# Patient Record
Sex: Male | Born: 1995 | Race: White | Hispanic: No | Marital: Married | Smoking: Former smoker
Health system: Southern US, Community
[De-identification: ages and names within clinical notes are randomized; demographics above are authoritative.]

## PROBLEM LIST (undated history)

## (undated) DIAGNOSIS — F32A Depression, unspecified: Secondary | ICD-10-CM

## (undated) DIAGNOSIS — F329 Major depressive disorder, single episode, unspecified: Secondary | ICD-10-CM

## (undated) DIAGNOSIS — K219 Gastro-esophageal reflux disease without esophagitis: Secondary | ICD-10-CM

## (undated) HISTORY — PX: HIP SURGERY: SHX245

---
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
## 2015-08-28 ENCOUNTER — Encounter: Payer: Self-pay | Admitting: Emergency Medicine

## 2015-08-28 ENCOUNTER — Emergency Department
Admission: EM | Admit: 2015-08-28 | Discharge: 2015-08-28 | Disposition: A | Discharge status: Home / Self Care | Payer: Self-pay | Attending: Emergency Medicine | Admitting: Emergency Medicine

## 2015-08-28 ENCOUNTER — Telehealth: Payer: Self-pay | Admitting: Medical Oncology

## 2015-08-28 DIAGNOSIS — R1013 Epigastric pain: Secondary | ICD-10-CM

## 2015-08-28 DIAGNOSIS — K2970 Gastritis, unspecified, without bleeding: Secondary | ICD-10-CM | POA: Insufficient documentation

## 2015-08-28 DIAGNOSIS — F17200 Nicotine dependence, unspecified, uncomplicated: Secondary | ICD-10-CM | POA: Insufficient documentation

## 2015-08-28 LAB — COMPREHENSIVE METABOLIC PANEL
ALK PHOS: 72 U/L (ref 38–126)
ALT: 37 U/L (ref 17–63)
ANION GAP: 10 (ref 5–15)
AST: 30 U/L (ref 15–41)
Albumin: 4.9 g/dL (ref 3.5–5.0)
BUN: 12 mg/dL (ref 6–20)
CALCIUM: 9.4 mg/dL (ref 8.9–10.3)
CO2: 26 mmol/L (ref 22–32)
Chloride: 104 mmol/L (ref 101–111)
Creatinine, Ser: 0.8 mg/dL (ref 0.61–1.24)
GFR calc non Af Amer: >60 mL/min (ref >60)
Glucose, Bld: 108 mg/dL — ABNORMAL HIGH (ref 65–99)
POTASSIUM: 3.6 mmol/L (ref 3.5–5.1)
SODIUM: 140 mmol/L (ref 135–145)
TOTAL PROTEIN: 8.4 g/dL — AB (ref 6.5–8.1)
Total Bilirubin: 0.9 mg/dL (ref 0.3–1.2)

## 2015-08-28 LAB — CBC WITH DIFFERENTIAL/PLATELET
BASOS PCT: 0 %
Basophils Absolute: 0 10*3/uL (ref 0–0.1)
Eosinophils Absolute: 0.1 10*3/uL (ref 0–0.7)
Eosinophils Relative: 1 %
HEMATOCRIT: 44.7 % (ref 40.0–52.0)
HEMOGLOBIN: 15.1 g/dL (ref 13.0–18.0)
LYMPHS ABS: 1.4 10*3/uL (ref 1.0–3.6)
Lymphocytes Relative: 8 %
MCH: 30.7 pg (ref 26.0–34.0)
MCHC: 33.9 g/dL (ref 32.0–36.0)
MCV: 90.7 fL (ref 80.0–100.0)
MONO ABS: 0.9 10*3/uL (ref 0.2–1.0)
MONOS PCT: 5 %
NEUTROS ABS: 15.4 10*3/uL — AB (ref 1.4–6.5)
Neutrophils Relative %: 86 %
Platelets: 237 10*3/uL (ref 150–440)
RBC: 4.93 MIL/uL (ref 4.40–5.90)
RDW: 12.8 % (ref 11.5–14.5)
WBC: 17.9 10*3/uL — AB (ref 3.8–10.6)

## 2015-08-28 LAB — LIPASE, BLOOD: Lipase: 19 U/L (ref 11–51)

## 2015-08-28 LAB — TROPONIN I

## 2015-08-28 MED ORDER — ONDANSETRON HCL 4 MG/2ML IJ SOLN
INTRAMUSCULAR | Status: AC
  Administered 2015-08-28: 4 mg via INTRAVENOUS
  Filled 2015-08-28: qty 2

## 2015-08-28 MED ORDER — HALOPERIDOL LACTATE 5 MG/ML IJ SOLN
5.0000 mg | Freq: Once | INTRAMUSCULAR | Status: AC
  Administered 2015-08-28: 5 mg via INTRAVENOUS

## 2015-08-28 MED ORDER — SUCRALFATE 1 G PO TABS: 1.0000 g | ORAL_TABLET | Freq: Four times a day (QID) | ORAL | Status: DC

## 2015-08-28 MED ORDER — RANITIDINE HCL 150 MG PO CAPS: 150.0000 mg | ORAL_CAPSULE | Freq: Two times a day (BID) | ORAL | Status: DC

## 2015-08-28 MED ORDER — HALOPERIDOL LACTATE 5 MG/ML IJ SOLN
INTRAMUSCULAR | Status: AC
  Administered 2015-08-28: 5 mg via INTRAVENOUS
  Filled 2015-08-28: qty 1

## 2015-08-28 MED ORDER — METOCLOPRAMIDE HCL 5 MG/ML IJ SOLN
10.0000 mg | Freq: Once | INTRAMUSCULAR | Status: AC
  Administered 2015-08-28: 10 mg via INTRAVENOUS

## 2015-08-28 MED ORDER — METOCLOPRAMIDE HCL 5 MG/ML IJ SOLN
10.0000 mg | Freq: Once | INTRAMUSCULAR | Status: DC
Start: 2015-08-28 — End: 2015-08-28
  Filled 2015-08-28 (×2): qty 2

## 2015-08-28 MED ORDER — ONDANSETRON HCL 4 MG/2ML IJ SOLN
4.0000 mg | Freq: Once | INTRAMUSCULAR | Status: AC
  Administered 2015-08-28: 4 mg via INTRAVENOUS

## 2015-08-28 MED ORDER — FAMOTIDINE 20 MG PO TABS
40.0000 mg | ORAL_TABLET | Freq: Once | ORAL | Status: AC
  Administered 2015-08-28: 40 mg via ORAL
  Filled 2015-08-28: qty 2

## 2015-08-28 MED ORDER — GI COCKTAIL ~~LOC~~
30.0000 mL | ORAL | Status: AC
  Administered 2015-08-28: 30 mL via ORAL
  Filled 2015-08-28: qty 30

## 2015-08-28 MED ORDER — METOCLOPRAMIDE HCL 10 MG PO TABS: 10.0000 mg | ORAL_TABLET | Freq: Three times a day (TID) | ORAL | Status: DC

## 2015-08-28 NOTE — Discharge Instructions (Signed)
Abdominal Pain, Adult °Many things can cause abdominal pain. Usually, abdominal pain is not caused by a disease and will improve without treatment. It can often be observed and treated at home. Your health care provider will do a physical exam and possibly order blood tests and X-rays to help determine the seriousness of your pain. However, in many cases, more time must pass before a clear cause of the pain can be found. Before that point, your health care provider may not know if you need more testing or further treatment. °HOME CARE INSTRUCTIONS °Monitor your abdominal pain for any changes. The following actions may help to alleviate any discomfort you are experiencing: °· Only take over-the-counter or prescription medicines as directed by your health care provider. °· Do not take laxatives unless directed to do so by your health care provider. °· Try a clear liquid diet (broth, tea, or water) as directed by your health care provider. Slowly move to a bland diet as tolerated. °SEEK MEDICAL CARE IF: °· You have unexplained abdominal pain. °· You have abdominal pain associated with nausea or diarrhea. °· You have pain when you urinate or have a bowel movement. °· You experience abdominal pain that wakes you in the night. °· You have abdominal pain that is worsened or improved by eating food. °· You have abdominal pain that is worsened with eating fatty foods. °· You have a fever. °SEEK IMMEDIATE MEDICAL CARE IF: °· Your pain does not go away within 2 hours. °· You keep throwing up (vomiting). °· Your pain is felt only in portions of the abdomen, such as the right side or the left lower portion of the abdomen. °· You pass bloody or black tarry stools. °MAKE SURE YOU: °· Understand these instructions. °· Will watch your condition. °· Will get help right away if you are not doing well or get worse. °  °This information is not intended to replace advice given to you by your health care provider. Make sure you discuss  any questions you have with your health care provider. °  °Document Released: 04/11/2005 Document Revised: 03/23/2015 Document Reviewed: 03/11/2013 °Elsevier Interactive Patient Education ©2016 [employer] ° °Gastritis, Adult °Gastritis is soreness and swelling (inflammation) of the lining of the stomach. Gastritis can develop as a sudden onset (acute) or long-term (chronic) condition. If gastritis is not treated, it can lead to stomach bleeding and ulcers. °CAUSES  °Gastritis occurs when the stomach lining is weak or damaged. Digestive juices from the stomach then inflame the weakened stomach lining. The stomach lining may be weak or damaged due to viral or bacterial infections. One common bacterial infection is the Helicobacter pylori infection. Gastritis can also result from excessive alcohol consumption, taking certain medicines, or having too much acid in the stomach.  °SYMPTOMS  °In some cases, there are no symptoms. When symptoms are present, they may include: °· Pain or a burning sensation in the upper abdomen. °· Nausea. °· Vomiting. °· An uncomfortable feeling of fullness after eating. °DIAGNOSIS  °Your caregiver may suspect you have gastritis based on your symptoms and a physical exam. To determine the cause of your gastritis, your caregiver may perform the following: °· Blood or stool tests to check for the H pylori bacterium. °· Gastroscopy. A thin, flexible tube (endoscope) is passed down the esophagus and into the stomach. The endoscope has a light and camera on the end. Your caregiver uses the endoscope to view the inside of the stomach. °· Taking a tissue sample (  biopsy) from the stomach to examine under a microscope. °TREATMENT  °Depending on the cause of your gastritis, medicines may be prescribed. If you have a bacterial infection, such as an H pylori infection, antibiotics may be given. If your gastritis is caused by too much acid in the stomach, H2 blockers or antacids may be given. Your  caregiver may recommend that you stop taking aspirin, ibuprofen, or other nonsteroidal anti-inflammatory drugs (NSAIDs). °HOME CARE INSTRUCTIONS °· Only take over-the-counter or prescription medicines as directed by your caregiver. °· If you were given antibiotic medicines, take them as directed. Finish them even if you start to feel better. °· Drink enough fluids to keep your urine clear or pale yellow. °· Avoid foods and drinks that make your symptoms worse, such as: °¨ Caffeine or alcoholic drinks. °¨ Chocolate. °¨ Peppermint or mint flavorings. °¨ Garlic and onions. °¨ Spicy foods. °¨ Citrus fruits, such as oranges, lemons, or limes. °¨ Tomato-based foods such as sauce, chili, salsa, and pizza. °¨ Fried and fatty foods. °· Eat small, frequent meals instead of large meals. °SEEK IMMEDIATE MEDICAL CARE IF:  °· You have black or dark red stools. °· You vomit blood or material that looks like coffee grounds. °· You are unable to keep fluids down. °· Your abdominal pain gets worse. °· You have a fever. °· You do not feel better after 1 week. °· You have any other questions or concerns. °MAKE SURE YOU: °· Understand these instructions. °· Will watch your condition. °· Will get help right away if you are not doing well or get worse. °  °This information is not intended to replace advice given to you by your health care provider. Make sure you discuss any questions you have with your health care provider. °  °Document Released: 06/26/2001 Document Revised: 01/01/2012 Document Reviewed: 08/15/2011 °Elsevier Interactive Patient Education ©2016 Elsevier Inc. ° °

## 2015-08-28 NOTE — ED Notes (Signed)
Pt states "I feel like I am going to throw up", pt encouraged to take slow deep breathes, pt lying in bed, cool clothe on face

## 2015-08-28 NOTE — ED Notes (Signed)
Pt states this AM at 0400 he woke up with nausea and vomiting, states he called EMS because he was "doubled over and had extreme chest pain and could not breathe", states they gave him 4 mg IM zofran, at present pt states 6/10 chest pain, mid chest, also states he is constipated and had a BM yesterday 2/11, pt awake and alert in no acute distress

## 2015-08-28 NOTE — ED Notes (Signed)
Pt has had nausea and vomiting since 4 am.  Also c/o left sided chest pain that is worse with movement.  Has been constipated per pt; last BM was last night.

## 2015-08-28 NOTE — ED Notes (Signed)
MD at bedside. 

## 2015-08-28 NOTE — ED Notes (Signed)
Pt given crackers per request, pt curled up in bed states his stomach hurts, MD notified, pt refused Reglan IV stating "I had a reaction to something IV when I was 11 and I dont remember what it is so I dont want anything"

## 2015-08-28 NOTE — ED Provider Notes (Signed)
[facility] Emergency Department Provider Note  ____________________________________________  Time seen: 7:55am  I have reviewed the triage vital signs and the nursing notes.   HISTORY  Chief Complaint Emesis and Chest Pain    HPI Bernard Benton is a 20 y.o. male who comes to the ED due to nausea and vomiting. He also has some upper abdominal pain. This all started at 4:00 AM when he woke up with nausea vomiting or abdominal pain. He feels that the abdominal pain is due to the vomiting itself. After multiple episodes of vomiting he now feels it in his chest and up to his neck as well. Denies any exertional symptoms, no pleuritic pain. There had anything like this before. Was in his usual state of health at bedtime. Does smoke but no excessive drinking. No trauma. Pain is sharp and intermittent.    History reviewed. No pertinent past medical history.   There are no active problems to display for this patient.    Past Surgical History  Procedure Laterality Date  . Hip surgery       Current Outpatient Rx  Name  Route  Sig  Dispense  Refill  . metoCLOPramide (REGLAN) 10 MG tablet   Oral   Take 1 tablet (10 mg total) by mouth 4 (four) times daily -  before meals and at bedtime.   60 tablet   0   . ranitidine (ZANTAC) 150 MG capsule   Oral   Take 1 capsule (150 mg total) by mouth 2 (two) times daily.   28 capsule   0   . sucralfate (CARAFATE) 1 g tablet   Oral   Take 1 tablet (1 g total) by mouth 4 (four) times daily.   120 tablet   1      Allergies Other   History reviewed. No pertinent family history.  Social History Social History  Substance Use Topics  . Smoking status: Current Every Day Smoker  . Smokeless tobacco: None  . Alcohol Use: Yes    Review of Systems  Constitutional:   No fever or chills. No weight changes Eyes:   No blurry vision or double vision.  ENT:   No sore throat. Cardiovascular:   Positive as above  chest pain. Respiratory:   No dyspnea or cough. Gastrointestinal:   Negative for abdominal pain, no diarrhea. Positive vomiting.  No BRBPR or melena. Genitourinary:   Negative for dysuria, urinary retention, bloody urine, or difficulty urinating. Musculoskeletal:   Negative for back pain. No joint swelling or pain. Skin:   Negative for rash. Neurological:   Negative for headaches, focal weakness or numbness. Psychiatric:  No anxiety or depression.   Endocrine:  No hot/cold intolerance, changes in energy, or sleep difficulty.  10-point ROS otherwise negative.  ____________________________________________   PHYSICAL EXAM:  VITAL SIGNS: ED Triage Vitals  Enc Vitals Group     BP 08/28/15 0740 113/72 mmHg     Pulse Rate 08/28/15 0740 46     Resp 08/28/15 0740 20     Temp 08/28/15 0740 97.6 F (36.4 C)     Temp Source 08/28/15 0740 Oral     SpO2 08/28/15 0740 95 %     Weight 08/28/15 0740 200 lb (90.719 kg)     Height 08/28/15 0740  (1.905 m)     Head Cir --      Peak Flow --      Pain Score 08/28/15 0738 4     Pain  Loc --      Pain Edu? --      Excl. in GC? --     Vital signs reviewed, nursing assessments reviewed.   Constitutional:   Alert and oriented. Well appearing and in no distress. Eyes:   No scleral icterus. No conjunctival pallor. PERRL. EOMI ENT   Head:   Normocephalic and atraumatic.   Nose:   No congestion/rhinnorhea. No septal hematoma   Mouth/Throat:   MMM, dull pharyngeal erythema. No peritonsillar mass. No uvula shift.   Neck:   No stridor. No SubQ emphysema. No meningismus. Hematological/Lymphatic/Immunilogical:   No cervical lymphadenopathy. Cardiovascular:   RRR. Normal and symmetric distal pulses are present in all extremities. No murmurs, rubs, or gallops. Respiratory:   Normal respiratory effort without tachypnea nor retractions. Breath sounds are clear and equal bilaterally. No wheezes/rales/rhonchi. Gastrointestinal:   Soft and  nontender. No distention. There is no CVA tenderness.  No rebound, rigidity, or guarding. No splenomegaly Genitourinary:   deferred Musculoskeletal:   Nontender with normal range of motion in all extremities. No joint effusions.  No lower extremity tenderness.  No edema. Neurologic:   Normal speech and language.  CN 2-10 normal. Motor grossly intact. No pronator drift.  Normal gait. No gross focal neurologic deficits are appreciated.  Skin:    Skin is warm, dry and intact. No rash noted.  No petechiae, purpura, or bullae. Psychiatric:   Mood and affect are normal. Speech and behavior are normal. Patient exhibits appropriate insight and judgment.  ____________________________________________    LABS (pertinent positives/negatives) (all labs ordered are listed, but only abnormal results are displayed) Labs Reviewed  COMPREHENSIVE METABOLIC PANEL - Abnormal; Notable for the following:    Glucose, Bld 108 (*)    Total Protein 8.4 (*)    All other components within normal limits  CBC WITH DIFFERENTIAL/PLATELET - Abnormal; Notable for the following:    WBC 17.9 (*)    Neutro Abs 15.4 (*)    All other components within normal limits  LIPASE, BLOOD  TROPONIN I   ____________________________________________   EKG  Interpreted by me Sinus bradycardia rate of 52, normal axis intervals QRS and ST segments and T waves.  ____________________________________________    RADIOLOGY    ____________________________________________   PROCEDURES   ____________________________________________   INITIAL IMPRESSION / ASSESSMENT AND PLAN / ED COURSE  Pertinent labs & imaging results that were available during my care of the patient were reviewed by me and considered in my medical decision making (see chart for details).   Patient presents with upper abdominal pain and nausea and vomiting.Considering the patient's symptoms, medical history, and physical examination today, I have low  suspicion for cholecystitis or biliary pathology, pancreatitis, perforation or bowel obstruction, hernia, intra-abdominal abscess, AAA or dissection, volvulus or intussusception, mesenteric ischemia, or appendicitis.  He is overall well-appearing, no acute distress. Tolerating oral intake with crackers. Most likely this is acid reflux and gastritis. Will start on antiacids and have him follow up with primary care. He does have a leukocytosis with a white count of almost 18,000, in the absence of any other findings on vitals physical exam or labs, this does not appear to require further workup at this time.     ____________________________________________   FINAL CLINICAL IMPRESSION(S) / ED DIAGNOSES  Final diagnoses:  Epigastric pain  Gastritis      Sharman Cheek, MD 08/28/15 [id number]

## 2019-02-09 ENCOUNTER — Encounter: Payer: Self-pay | Admitting: Emergency Medicine

## 2019-02-09 ENCOUNTER — Other Ambulatory Visit: Payer: Self-pay

## 2019-02-09 ENCOUNTER — Emergency Department: Payer: Self-pay

## 2019-02-09 ENCOUNTER — Emergency Department
Admission: EM | Admit: 2019-02-09 | Discharge: 2019-02-09 | Disposition: A | Discharge status: Home / Self Care | Payer: Self-pay | Attending: Student in an Organized Health Care Education/Training Program | Admitting: Student in an Organized Health Care Education/Training Program

## 2019-02-09 DIAGNOSIS — Z20828 Contact with and (suspected) exposure to other viral communicable diseases: Secondary | ICD-10-CM | POA: Insufficient documentation

## 2019-02-09 DIAGNOSIS — R112 Nausea with vomiting, unspecified: Secondary | ICD-10-CM | POA: Insufficient documentation

## 2019-02-09 DIAGNOSIS — Z87891 Personal history of nicotine dependence: Secondary | ICD-10-CM | POA: Insufficient documentation

## 2019-02-09 HISTORY — DX: Gastro-esophageal reflux disease without esophagitis: K21.9

## 2019-02-09 HISTORY — DX: Depression, unspecified: F32.A

## 2019-02-09 LAB — CBC WITH DIFFERENTIAL/PLATELET
Abs Immature Granulocytes: 0.05 10*3/uL (ref 0.00–0.07)
Basophils Absolute: 0.1 10*3/uL (ref 0.0–0.1)
Basophils Relative: 1 %
Eosinophils Absolute: 0.4 10*3/uL (ref 0.0–0.5)
Eosinophils Relative: 4 %
HCT: 43.6 % (ref 39.0–52.0)
Hemoglobin: 15.1 g/dL (ref 13.0–17.0)
Immature Granulocytes: 0 %
Lymphocytes Relative: 22 %
Lymphs Abs: 2.5 10*3/uL (ref 0.7–4.0)
MCH: 30.6 pg (ref 26.0–34.0)
MCHC: 34.6 g/dL (ref 30.0–36.0)
MCV: 88.4 fL (ref 80.0–100.0)
Monocytes Absolute: 0.9 10*3/uL (ref 0.1–1.0)
Monocytes Relative: 8 %
Neutro Abs: 7.6 10*3/uL (ref 1.7–7.7)
Neutrophils Relative %: 65 %
Platelets: 277 10*3/uL (ref 150–400)
RBC: 4.93 MIL/uL (ref 4.22–5.81)
RDW: 12.2 % (ref 11.5–15.5)
WBC: 11.6 10*3/uL — ABNORMAL HIGH (ref 4.0–10.5)
nRBC: 0 % (ref 0.0–0.2)

## 2019-02-09 LAB — URINALYSIS, COMPLETE (UACMP) WITH MICROSCOPIC
Bacteria, UA: NONE SEEN
Bilirubin Urine: NEGATIVE
Glucose, UA: NEGATIVE mg/dL
Hgb urine dipstick: NEGATIVE
Ketones, ur: NEGATIVE mg/dL
Leukocytes,Ua: NEGATIVE
Nitrite: NEGATIVE
Protein, ur: 100 mg/dL — AB
Specific Gravity, Urine: 1.025 (ref 1.005–1.030)
Squamous Epithelial / LPF: NONE SEEN (ref 0–5)
pH: 8 (ref 5.0–8.0)

## 2019-02-09 LAB — COMPREHENSIVE METABOLIC PANEL
ALT: 37 U/L (ref 0–44)
AST: 33 U/L (ref 15–41)
Albumin: 4.1 g/dL (ref 3.5–5.0)
Alkaline Phosphatase: 91 U/L (ref 38–126)
Anion gap: 12 (ref 5–15)
BUN: 12 mg/dL (ref 6–20)
CO2: 20 mmol/L — ABNORMAL LOW (ref 22–32)
Calcium: 9.3 mg/dL (ref 8.9–10.3)
Chloride: 108 mmol/L (ref 98–111)
Creatinine, Ser: 0.88 mg/dL (ref 0.61–1.24)
GFR calc Af Amer: >60 mL/min (ref >60)
GFR calc non Af Amer: >60 mL/min (ref >60)
Glucose, Bld: 125 mg/dL — ABNORMAL HIGH (ref 70–99)
Potassium: 3.6 mmol/L (ref 3.5–5.1)
Sodium: 140 mmol/L (ref 135–145)
Total Bilirubin: 0.9 mg/dL (ref 0.3–1.2)
Total Protein: 7.6 g/dL (ref 6.5–8.1)

## 2019-02-09 LAB — URINE DRUG SCREEN, QUALITATIVE (ARMC ONLY)
Amphetamines, Ur Screen: NOT DETECTED
Barbiturates, Ur Screen: NOT DETECTED
Benzodiazepine, Ur Scrn: NOT DETECTED
Cannabinoid 50 Ng, Ur ~~LOC~~: POSITIVE — AB
Cocaine Metabolite,Ur ~~LOC~~: NOT DETECTED
MDMA (Ecstasy)Ur Screen: NOT DETECTED
Methadone Scn, Ur: NOT DETECTED
Opiate, Ur Screen: POSITIVE — AB
Phencyclidine (PCP) Ur S: NOT DETECTED
Tricyclic, Ur Screen: NOT DETECTED

## 2019-02-09 LAB — LIPASE, BLOOD: Lipase: 22 U/L (ref 11–51)

## 2019-02-09 LAB — SARS CORONAVIRUS 2 BY RT PCR (HOSPITAL ORDER, PERFORMED IN ~~LOC~~ HOSPITAL LAB): SARS Coronavirus 2: NEGATIVE

## 2019-02-09 MED ORDER — SODIUM CHLORIDE 0.9 % IV BOLUS
1000.0000 mL | Freq: Once | INTRAVENOUS | Status: AC
  Administered 2019-02-09: 1000 mL via INTRAVENOUS

## 2019-02-09 MED ORDER — PROMETHAZINE HCL 12.5 MG PO TABS: 12.5000 mg | ORAL_TABLET | Freq: Four times a day (QID) | ORAL | 0 refills | Status: DC | PRN

## 2019-02-09 MED ORDER — MORPHINE SULFATE (PF) 4 MG/ML IV SOLN
4.0000 mg | INTRAVENOUS | Status: DC | PRN
  Administered 2019-02-09: 09:00:00 4 mg via INTRAVENOUS
  Filled 2019-02-09: qty 1

## 2019-02-09 MED ORDER — ONDANSETRON HCL 4 MG/2ML IJ SOLN
4.0000 mg | Freq: Once | INTRAMUSCULAR | Status: AC
  Administered 2019-02-09: 09:00:00 4 mg via INTRAVENOUS
  Filled 2019-02-09: qty 2

## 2019-02-09 MED ORDER — PROMETHAZINE HCL 25 MG/ML IJ SOLN
12.5000 mg | Freq: Four times a day (QID) | INTRAMUSCULAR | Status: DC | PRN
  Administered 2019-02-09: 09:00:00 12.5 mg via INTRAVENOUS
  Filled 2019-02-09: qty 1

## 2019-02-09 NOTE — ED Provider Notes (Signed)
[facility] Emergency Department Provider Note    First MD Initiated Contact with Patient 02/09/19 [phone]     (approximate)  I have reviewed the triage vital signs and the nursing notes.   HISTORY  Chief Complaint Emesis    HPI Bernard Benton is a 23 y.o. male previously healthy young male that works at "[employer] "presents the ER for evaluation of nausea vomiting diarrhea and some shortness of breath that started early this morning.  Says he has been working several days out in the heat.  Felt like he was trying to stay hydrated but has noted darker urine over the past several days.  Denies any measured fevers.  Denies any abdominal pain but will have some crampy discomfort related to the vomiting and diarrhea.  Denies any cough.  Obviously has multiple exposures working at a [employer].    Past Medical History:  Diagnosis Date   Acid reflux    Depressed    No family history on file. Past Surgical History:  Procedure Laterality Date   HIP SURGERY     There are no active problems to display for this patient.     Prior to Admission medications   Medication Sig Start Date End Date Taking? Authorizing Provider  metoCLOPramide (REGLAN) 10 MG tablet Take 1 tablet (10 mg total) by mouth 4 (four) times daily -  before meals and at bedtime. 08/28/15   Carrie Mew, MD  promethazine (PHENERGAN) 12.5 MG tablet Take 1 tablet (12.5 mg total) by mouth every 6 (six) hours as needed. 02/09/19   Merlyn Lot, MD  ranitidine (ZANTAC) 150 MG capsule Take 1 capsule (150 mg total) by mouth 2 (two) times daily. 08/28/15   Carrie Mew, MD  sucralfate (CARAFATE) 1 g tablet Take 1 tablet (1 g total) by mouth 4 (four) times daily. 08/28/15   Carrie Mew, MD    Allergies Other    Social History Social History   Tobacco Use   Smoking status: Former Smoker   Smokeless tobacco: Never Used  Substance Use Topics   Alcohol  use: Yes    Comment: occassionally   Drug use: Yes    Types: Marijuana    Comment: occassional    Review of Systems Patient denies headaches, rhinorrhea, blurry vision, numbness, shortness of breath, chest pain, edema, cough, abdominal pain, nausea, vomiting, diarrhea, dysuria, fevers, rashes or hallucinations unless otherwise stated above in HPI. ____________________________________________   PHYSICAL EXAM:  VITAL SIGNS: Vitals:   02/09/19 1506 02/09/19 1514  BP:    Pulse: (!) 58 (!) 46  Resp:    Temp:    SpO2: 100% 100%    Constitutional: Alert and oriented.  Eyes: Conjunctivae are normal.  Head: Atraumatic. Nose: No congestion/rhinnorhea. Mouth/Throat: Mucous membranes are moist.   Neck: No stridor. Painless ROM.  Cardiovascular: Normal rate, regular rhythm. Grossly normal heart sounds.  Good peripheral circulation. Respiratory: Normal respiratory effort.  No retractions. Lungs CTAB. Gastrointestinal: Soft and nontender in all four quadrants. No distention. No abdominal bruits. No CVA tenderness. Genitourinary:  Musculoskeletal: No lower extremity tenderness nor edema.  No joint effusions. Neurologic:  Normal speech and language. No gross focal neurologic deficits are appreciated. No facial droop Skin:  Skin is warm, dry and intact. No rash noted. Psychiatric: Mood and affect are normal. Speech and behavior are normal.  ____________________________________________   LABS (all labs ordered are listed, but only abnormal results are displayed)  Results for orders placed or performed  during the hospital encounter of 02/09/19 (from the past 24 hour(s))  CBC with Differential/Platelet     Status: Abnormal   Collection Time: 02/09/19  9:09 AM  Result Value Ref Range   WBC 11.6 (H) 4.0 - 10.5 K/uL   RBC 4.93 4.22 - 5.81 MIL/uL   Hemoglobin 15.1 13.0 - 17.0 g/dL   HCT 40.943.6 81.139.0 - 91.452.0 %   MCV 88.4 80.0 - 100.0 fL   MCH 30.6 26.0 - 34.0 pg   MCHC 34.6 30.0 - 36.0 g/dL     RDW 78.212.2 95.611.5 - 21.315.5 %   Platelets 277 150 - 400 K/uL   nRBC 0.0 0.0 - 0.2 %   Neutrophils Relative % 65 %   Neutro Abs 7.6 1.7 - 7.7 K/uL   Lymphocytes Relative 22 %   Lymphs Abs 2.5 0.7 - 4.0 K/uL   Monocytes Relative 8 %   Monocytes Absolute 0.9 0.1 - 1.0 K/uL   Eosinophils Relative 4 %   Eosinophils Absolute 0.4 0.0 - 0.5 K/uL   Basophils Relative 1 %   Basophils Absolute 0.1 0.0 - 0.1 K/uL   Immature Granulocytes 0 %   Abs Immature Granulocytes 0.05 0.00 - 0.07 K/uL  Comprehensive metabolic panel     Status: Abnormal   Collection Time: 02/09/19  9:09 AM  Result Value Ref Range   Sodium 140 135 - 145 mmol/L   Potassium 3.6 3.5 - 5.1 mmol/L   Chloride 108 98 - 111 mmol/L   CO2 20 (L) 22 - 32 mmol/L   Glucose, Bld 125 (H) 70 - 99 mg/dL   BUN 12 6 - 20 mg/dL   Creatinine, Ser 0.860.88 0.61 - 1.24 mg/dL   Calcium 9.3 8.9 - 57.810.3 mg/dL   Total Protein 7.6 6.5 - 8.1 g/dL   Albumin 4.1 3.5 - 5.0 g/dL   AST 33 15 - 41 U/L   ALT 37 0 - 44 U/L   Alkaline Phosphatase 91 38 - 126 U/L   Total Bilirubin 0.9 0.3 - 1.2 mg/dL   GFR calc non Af Amer >60 >60 mL/min   GFR calc Af Amer >60 >60 mL/min   Anion gap 12 5 - 15  Lipase, blood     Status: None   Collection Time: 02/09/19  9:09 AM  Result Value Ref Range   Lipase 22 11 - 51 U/L  SARS Coronavirus 2 (CEPHEID- Performed in [facility] hospital lab), Hosp Order     Status: None   Collection Time: 02/09/19  9:10 AM   Specimen: Nasopharyngeal Swab  Result Value Ref Range   SARS Coronavirus 2 NEGATIVE NEGATIVE  Urinalysis, Complete w Microscopic     Status: Abnormal   Collection Time: 02/09/19  9:19 AM  Result Value Ref Range   Color, Urine YELLOW (A) YELLOW   APPearance HAZY (A) CLEAR   Specific Gravity, Urine 1.025 1.005 - 1.030   pH 8.0 5.0 - 8.0   Glucose, UA NEGATIVE NEGATIVE mg/dL   Hgb urine dipstick NEGATIVE NEGATIVE   Bilirubin Urine NEGATIVE NEGATIVE   Ketones, ur NEGATIVE NEGATIVE mg/dL   Protein, ur 469100 (A) NEGATIVE  mg/dL   Nitrite NEGATIVE NEGATIVE   Leukocytes,Ua NEGATIVE NEGATIVE   RBC / HPF 0-5 0 - 5 RBC/hpf   WBC, UA 0-5 0 - 5 WBC/hpf   Bacteria, UA NONE SEEN NONE SEEN   Squamous Epithelial / LPF NONE SEEN 0 - 5   Mucus PRESENT    ____________________________________________  EKG My  review and personal interpretation at Time: 9:00   Indication: nausea  Rate: 55  Rhythm: sinus Axis: normal Other: normal intervals,no stemi ____________________________________________  RADIOLOGY  I personally reviewed all radiographic images ordered to evaluate for the above acute complaints and reviewed radiology reports and findings.  These findings were personally discussed with the patient.  Please see medical record for radiology report.  ____________________________________________   PROCEDURES  Procedure(s) performed:  Procedures    Critical Care performed: no ____________________________________________   INITIAL IMPRESSION / ASSESSMENT AND PLAN / ED COURSE  Pertinent labs & imaging results that were available during my care of the patient were reviewed by me and considered in my medical decision making (see chart for details).   DDX: Duration, enteritis, COVID-19, hyperglycemia, appendicitis, cholelithiasis, cholecystitis, pancreatitis, food borne illness  Bernard Benton is a 23 y.o. who presents to the ED with nausea vomiting diarrhea once as described above.  Will give IV fluids as well as IV antibiotics.  His abdominal exam at this point is soft and benign.  Is nontoxic-appearing.  Possible viral illness.  Will order blood work for by differential.  Clinical Course as of Feb 08 1518  Mon Feb 09, 2019  1413 Patient feeling better at this point.  Is tolerating oral hydration.  Repeat abdominal exam is soft and benign.  Have low suspicion for appendicitis cholelithiasis.  Likely some form of enteritis but at this point I do not see any indication CT or ultrasound based on his clinical  presentation.  I think that a trial of outpatient management with antiemetics with strict return precautions as appropriate.  Have discussed with the patient and available family all diagnostics and treatments performed thus far and all questions were answered to the best of my ability. The patient demonstrates understanding and agreement with plan.    [PR]    Clinical Course User Index [PR] Willy Eddyobinson, Magda Muise, MD    The patient was evaluated in Emergency Department today for the symptoms described in the history of present illness. He/she was evaluated in the context of the global COVID-19 pandemic, which necessitated consideration that the patient might be at risk for infection with the SARS-CoV-2 virus that causes COVID-19. Institutional protocols and algorithms that pertain to the evaluation of patients at risk for COVID-19 are in a state of rapid change based on information released by regulatory bodies including the CDC and federal and state organizations. These policies and algorithms were followed during the patient's care in the ED.  As part of my medical decision making, I reviewed the following data within the electronic MEDICAL RECORD NUMBER Nursing notes reviewed and incorporated, Labs reviewed, notes from prior ED visits and [place] Controlled Substance Database   ____________________________________________   FINAL CLINICAL IMPRESSION(S) / ED DIAGNOSES  Final diagnoses:  Non-intractable vomiting with nausea, unspecified vomiting type      NEW MEDICATIONS STARTED DURING THIS VISIT:  New Prescriptions   PROMETHAZINE (PHENERGAN) 12.5 MG TABLET    Take 1 tablet (12.5 mg total) by mouth every 6 (six) hours as needed.     Note:  This document was prepared using Dragon voice recognition software and may include unintentional dictation errors.    Willy Eddyobinson, Riddhi Grether, MD 02/09/19 [phone]

## 2019-02-09 NOTE — ED Notes (Signed)
Pt sleeping. Bed locked low. Rail up. Call bell within reach.  

## 2019-02-09 NOTE — ED Notes (Signed)
Pt attempting for urine sample now.  

## 2019-02-09 NOTE — ED Triage Notes (Signed)
Pt reports multiple episodes of vomiting today, reports overall body weakness. Pt here via EMS, EMS reports pt's HR in 50s with episodes of dropping into the 40s. Pt vomiting upon arrival. 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

## 2019-02-09 NOTE — ED Notes (Addendum)
Pt sleeping upon entrance to room; states dec pain and no nausea currently. Given brief update. Pt understands a urine sample is needed.

## 2019-02-09 NOTE — Discharge Instructions (Addendum)

## 2020-09-05 ENCOUNTER — Other Ambulatory Visit: Payer: Self-pay

## 2020-09-05 ENCOUNTER — Emergency Department
Admission: EM | Admit: 2020-09-05 | Discharge: 2020-09-05 | Disposition: A | Discharge status: Home / Self Care | Payer: Self-pay | Attending: Emergency Medicine | Admitting: Emergency Medicine

## 2020-09-05 DIAGNOSIS — R079 Chest pain, unspecified: Secondary | ICD-10-CM | POA: Insufficient documentation

## 2020-09-05 DIAGNOSIS — R1013 Epigastric pain: Secondary | ICD-10-CM | POA: Insufficient documentation

## 2020-09-05 DIAGNOSIS — R61 Generalized hyperhidrosis: Secondary | ICD-10-CM | POA: Insufficient documentation

## 2020-09-05 DIAGNOSIS — R112 Nausea with vomiting, unspecified: Secondary | ICD-10-CM | POA: Insufficient documentation

## 2020-09-05 DIAGNOSIS — Z87891 Personal history of nicotine dependence: Secondary | ICD-10-CM | POA: Insufficient documentation

## 2020-09-05 DIAGNOSIS — Z20822 Contact with and (suspected) exposure to covid-19: Secondary | ICD-10-CM | POA: Insufficient documentation

## 2020-09-05 LAB — CBC
HCT: 42.5 % (ref 39.0–52.0)
Hemoglobin: 14.6 g/dL (ref 13.0–17.0)
MCH: 30.5 pg (ref 26.0–34.0)
MCHC: 34.4 g/dL (ref 30.0–36.0)
MCV: 88.7 fL (ref 80.0–100.0)
Platelets: 325 10*3/uL (ref 150–400)
RBC: 4.79 MIL/uL (ref 4.22–5.81)
RDW: 12.1 % (ref 11.5–15.5)
WBC: 17.9 10*3/uL — ABNORMAL HIGH (ref 4.0–10.5)
nRBC: 0 % (ref 0.0–0.2)

## 2020-09-05 LAB — RESP PANEL BY RT-PCR (FLU A&B, COVID) ARPGX2
Influenza A by PCR: NEGATIVE
Influenza B by PCR: NEGATIVE
SARS Coronavirus 2 by RT PCR: NEGATIVE

## 2020-09-05 LAB — COMPREHENSIVE METABOLIC PANEL
ALT: 61 U/L — ABNORMAL HIGH (ref 0–44)
AST: 39 U/L (ref 15–41)
Albumin: 4.4 g/dL (ref 3.5–5.0)
Alkaline Phosphatase: 107 U/L (ref 38–126)
Anion gap: 12 (ref 5–15)
BUN: 11 mg/dL (ref 6–20)
CO2: 22 mmol/L (ref 22–32)
Calcium: 9.3 mg/dL (ref 8.9–10.3)
Chloride: 104 mmol/L (ref 98–111)
Creatinine, Ser: 1.01 mg/dL (ref 0.61–1.24)
GFR, Estimated: >60 mL/min (ref >60)
Glucose, Bld: 170 mg/dL — ABNORMAL HIGH (ref 70–99)
Potassium: 3.8 mmol/L (ref 3.5–5.1)
Sodium: 138 mmol/L (ref 135–145)
Total Bilirubin: 0.9 mg/dL (ref 0.3–1.2)
Total Protein: 8.3 g/dL — ABNORMAL HIGH (ref 6.5–8.1)

## 2020-09-05 LAB — LIPASE, BLOOD: Lipase: 23 U/L (ref 11–51)

## 2020-09-05 LAB — TROPONIN I (HIGH SENSITIVITY): Troponin I (High Sensitivity): 4 ng/L (ref <18)

## 2020-09-05 MED ORDER — PROMETHAZINE HCL 25 MG/ML IJ SOLN
25.0000 mg | Freq: Once | INTRAMUSCULAR | Status: AC
  Administered 2020-09-05: 25 mg via INTRAVENOUS
  Filled 2020-09-05: qty 1

## 2020-09-05 MED ORDER — SODIUM CHLORIDE 0.9 % IV BOLUS
1000.0000 mL | Freq: Once | INTRAVENOUS | Status: AC
  Administered 2020-09-05: 1000 mL via INTRAVENOUS

## 2020-09-05 MED ORDER — PROMETHAZINE HCL 25 MG PO TABS: 25.0000 mg | ORAL_TABLET | Freq: Four times a day (QID) | ORAL | 0 refills | Status: DC | PRN

## 2020-09-05 MED ORDER — DROPERIDOL 2.5 MG/ML IJ SOLN
2.5000 mg | Freq: Once | INTRAMUSCULAR | Status: AC
  Administered 2020-09-05: 2.5 mg via INTRAVENOUS
  Filled 2020-09-05: qty 2

## 2020-09-05 MED ORDER — LACTATED RINGERS IV BOLUS
1000.0000 mL | Freq: Once | INTRAVENOUS | Status: AC
  Administered 2020-09-05: 1000 mL via INTRAVENOUS

## 2020-09-05 NOTE — ED Notes (Signed)
Pt verbalized understanding of d/c instructions at this time. Pt denied further questions at this time. Pt given blue scrub top for d/c.  Pt ambulatory to lobby at this time, NAD noted, steady gait noted, RR even and unlabored at this time.

## 2020-09-05 NOTE — ED Triage Notes (Signed)
Pt to ED [facility] from home for epigastric pain 4am. Pt with emesis. Diaphoretic and pale. fentanyl and 4mg  zofran 700 NS PTA 20 g L [facility] Pt moaning appears very uncomfortable

## 2020-09-05 NOTE — ED Notes (Signed)
Unable to keep cardiac monitor on d/t pt diaphoretic and taking off leads.  Pt screaming and cursing, reassured pt we are trying to treat his sx

## 2020-09-05 NOTE — ED Notes (Signed)
HOB lowered for pt comfort at this time. Pt given urinal to provide urine sample at this time.

## 2020-09-05 NOTE — ED Notes (Signed)
Pt resting at this time.

## 2020-09-05 NOTE — ED Notes (Signed)
Pt spouse Toma Copier updated on pt status at this time.

## 2020-09-05 NOTE — ED Provider Notes (Signed)
[facility] Emergency Department Provider Note  Time seen: 9:57 AM  I have reviewed the triage vital signs and the nursing notes.   HISTORY  Chief Complaint Chest Pain and Emesis   HPI Bernard Benton is a 25 y.o. male with a past medical history of gastric reflux, depression/mood disorder, presents to the emergency department for nausea vomiting.  According to the patient he woke around 4:00 this morning with nausea vomiting upper abdominal pain.  States he did have diarrhea upon awakening but that has since resolved.  Patient called EMS due to intractable nausea vomiting.  EMS states upon arrival patient very pale diaphoretic with active vomiting.  Patient received fentanyl and Zofran prior to arrival.  Here the patient remains somewhat pale and diaphoretic patient continues to vomit at times.  States moderate abdominal pain more so in the upper abdomen.   Patient states this has happened several times in the past but he did not have insurance to get it checked out per patient.  Denies alcohol or drug use including marijuana.  Past Medical History:  Diagnosis Date  . Acid reflux   . Depressed     There are no problems to display for this patient.   Past Surgical History:  Procedure Laterality Date  . HIP SURGERY      Prior to Admission medications   Medication Sig Start Date End Date Taking? Authorizing Provider  metoCLOPramide (REGLAN) 10 MG tablet Take 1 tablet (10 mg total) by mouth 4 (four) times daily -  before meals and at bedtime. 08/28/15   Sharman Cheek, MD  promethazine (PHENERGAN) 12.5 MG tablet Take 1 tablet (12.5 mg total) by mouth every 6 (six) hours as needed. 02/09/19   Willy Eddy, MD  ranitidine (ZANTAC) 150 MG capsule Take 1 capsule (150 mg total) by mouth 2 (two) times daily. 08/28/15   Sharman Cheek, MD  sucralfate (CARAFATE) 1 g tablet Take 1 tablet (1 g total) by mouth 4 (four) times daily. 08/28/15   Sharman Cheek, MD     Allergies  Allergen Reactions  . Other     Pt reports allergic to an IV ABX but does not remember which one    No family history on file.  Social History Social History   Tobacco Use  . Smoking status: Former Games developer  . Smokeless tobacco: Never Used  Vaping Use  . Vaping Use: Never used  Substance Use Topics  . Alcohol use: Yes    Comment: occassionally  . Drug use: Yes    Types: Marijuana    Comment: occassional    Review of Systems Constitutional: Negative for fever. Cardiovascular: Negative for chest pain. Respiratory: Negative for shortness of breath. Gastrointestinal: Moderate upper abdominal pain.  Positive for nausea vomiting.  Mild diarrhea which has since resolved. Genitourinary: Negative for urinary compaints Musculoskeletal: Negative for musculoskeletal complaints Neurological: Negative for headache All other ROS negative  ____________________________________________   PHYSICAL EXAM:  VITAL SIGNS: ED Triage Vitals  Enc Vitals Group     BP 09/05/20 0956 136/79     Pulse Rate 09/05/20 0956 (!) 46     Resp 09/05/20 0956 14     Temp 09/05/20 0952 97.7 F (36.5 C)     Temp Source 09/05/20 0952 Oral     SpO2 09/05/20 0952 100 %     Weight 09/05/20 0953 232 lb 5.8 oz (105.4 kg)     Height 09/05/20 0953 6\' 1"  (1.854 m)     Head  Circumference --      Peak Flow --      Pain Score 09/05/20 0952 10     Pain Loc --      Pain Edu? --      Excl. in GC? --     Constitutional: Actively vomiting.  Alert, oriented, able to answer questions. Eyes: Normal exam ENT      Head: Normocephalic and atraumatic.      Mouth/Throat: Mucous membranes are moist. Cardiovascular: Normal rate, regular rhythm.  Respiratory: Normal respiratory effort without tachypnea nor retractions. Breath sounds are clear Gastrointestinal: Soft and nontender. No distention.   Musculoskeletal: Nontender with normal range of motion in all extremities.  Neurologic:  Normal speech and  language. No gross focal neurologic deficits  Skin: Skin is somewhat pale in appearance, mildly diaphoretic. Psychiatric: Mood and affect are normal.   ____________________________________________    EKG  EKG viewed and interpreted by myself shows what appears to be sinus bradycardia at 50 bpm with a right axis deviation, normal intervals and no concerning ST changes.   INITIAL IMPRESSION / ASSESSMENT AND PLAN / ED COURSE  Pertinent labs & imaging results that were available during my care of the patient were reviewed by me and considered in my medical decision making (see chart for details).   Patient presents emergency department for nausea vomiting diarrhea and abdominal pain.  Patient actually nauseated and vomiting in the emergency department.  Pulse rate in the mid 40s to 50 bpm, possibly vagal episode.  Remains mildly diaphoretic.  We will check labs, treat nausea and IV hydrate while awaiting results.  Differential is quite broad at this time but would include gastritis, gastroenteritis, pancreatitis, biliary pathology, infectious etiology.  Patient is now awake and states he is feeling better.  We will p.o. trial.  If the patient is able to tolerate oral we will discharge home with nausea medication.  Patient agreeable to plan of care.  Has no abdominal pain.  Discussed return precautions.  [name] was evaluated in Emergency Department on 09/05/2020 for the symptoms described in the history of present illness. He was evaluated in the context of the global COVID-19 pandemic, which necessitated consideration that the patient might be at risk for infection with the SARS-CoV-2 virus that causes COVID-19. Institutional protocols and algorithms that pertain to the evaluation of patients at risk for COVID-19 are in a state of rapid change based on information released by regulatory bodies including the CDC and federal and state organizations. These policies and algorithms were followed  during the patient's care in the ED.  ____________________________________________   FINAL CLINICAL IMPRESSION(S) / ED DIAGNOSES  Nausea vomiting Abdominal pain   Minna Antis, MD 09/05/20 1439

## 2020-09-05 NOTE — ED Notes (Signed)
Unable to obtain urine sample at this time, pt continuing to yell and scream. Urinal, emesis bag and call bell at bedside.  Pt dry heaving

## 2020-10-03 ENCOUNTER — Other Ambulatory Visit: Payer: Self-pay

## 2020-10-03 DIAGNOSIS — Z87891 Personal history of nicotine dependence: Secondary | ICD-10-CM | POA: Insufficient documentation

## 2020-10-03 DIAGNOSIS — R1010 Upper abdominal pain, unspecified: Secondary | ICD-10-CM | POA: Insufficient documentation

## 2020-10-03 DIAGNOSIS — R197 Diarrhea, unspecified: Secondary | ICD-10-CM | POA: Insufficient documentation

## 2020-10-03 DIAGNOSIS — R112 Nausea with vomiting, unspecified: Secondary | ICD-10-CM | POA: Insufficient documentation

## 2020-10-03 LAB — CBC
HCT: 44.3 % (ref 39.0–52.0)
Hemoglobin: 15.1 g/dL (ref 13.0–17.0)
MCH: 30.1 pg (ref 26.0–34.0)
MCHC: 34.1 g/dL (ref 30.0–36.0)
MCV: 88.2 fL (ref 80.0–100.0)
Platelets: 287 10*3/uL (ref 150–400)
RBC: 5.02 MIL/uL (ref 4.22–5.81)
RDW: 12 % (ref 11.5–15.5)
WBC: 18.1 10*3/uL — ABNORMAL HIGH (ref 4.0–10.5)
nRBC: 0 % (ref 0.0–0.2)

## 2020-10-03 LAB — COMPREHENSIVE METABOLIC PANEL
ALT: 25 U/L (ref 0–44)
AST: 23 U/L (ref 15–41)
Albumin: 4.6 g/dL (ref 3.5–5.0)
Alkaline Phosphatase: 126 U/L (ref 38–126)
Anion gap: 9 (ref 5–15)
BUN: 17 mg/dL (ref 6–20)
CO2: 21 mmol/L — ABNORMAL LOW (ref 22–32)
Calcium: 9.3 mg/dL (ref 8.9–10.3)
Chloride: 107 mmol/L (ref 98–111)
Creatinine, Ser: 0.89 mg/dL (ref 0.61–1.24)
GFR, Estimated: >60 mL/min (ref >60)
Glucose, Bld: 119 mg/dL — ABNORMAL HIGH (ref 70–99)
Potassium: 3.8 mmol/L (ref 3.5–5.1)
Sodium: 137 mmol/L (ref 135–145)
Total Bilirubin: 1.2 mg/dL (ref 0.3–1.2)
Total Protein: 8.3 g/dL — ABNORMAL HIGH (ref 6.5–8.1)

## 2020-10-03 LAB — LIPASE, BLOOD: Lipase: 24 U/L (ref 11–51)

## 2020-10-03 LAB — TROPONIN I (HIGH SENSITIVITY): Troponin I (High Sensitivity): 3 ng/L (ref <18)

## 2020-10-03 NOTE — ED Triage Notes (Signed)
EMS brings pt in for c/o abd pain accomp by N/V, hx of same

## 2020-10-03 NOTE — ED Triage Notes (Signed)
Pt in with co n.v.d that started today, also co luq pain. Pt states has had the same for years but never had any testing done.

## 2020-10-04 ENCOUNTER — Emergency Department
Admission: EM | Admit: 2020-10-04 | Discharge: 2020-10-04 | Disposition: A | Discharge status: Home / Self Care | Payer: Self-pay | Attending: Emergency Medicine | Admitting: Emergency Medicine

## 2020-10-04 ENCOUNTER — Emergency Department: Payer: Self-pay

## 2020-10-04 DIAGNOSIS — R197 Diarrhea, unspecified: Secondary | ICD-10-CM

## 2020-10-04 DIAGNOSIS — R112 Nausea with vomiting, unspecified: Secondary | ICD-10-CM

## 2020-10-04 LAB — URINALYSIS, COMPLETE (UACMP) WITH MICROSCOPIC
Bacteria, UA: NONE SEEN
Bilirubin Urine: NEGATIVE
Glucose, UA: 150 mg/dL — AB
Hgb urine dipstick: NEGATIVE
Ketones, ur: 80 mg/dL — AB
Leukocytes,Ua: NEGATIVE
Nitrite: NEGATIVE
Protein, ur: NEGATIVE mg/dL
Specific Gravity, Urine: >1.046 — ABNORMAL HIGH (ref 1.005–1.030)
Squamous Epithelial / HPF: NONE SEEN (ref 0–5)
pH: 7 (ref 5.0–8.0)

## 2020-10-04 LAB — TROPONIN I (HIGH SENSITIVITY): Troponin I (High Sensitivity): 3 ng/L (ref <18)

## 2020-10-04 IMAGING — CT CT ABD-PELV W/ CM
2 of 4 series · 15 of 46 positions shown, 17 images · IV contrast (APPLIED)
Comparison: None.

CLINICAL DATA: Left flank pain, nausea vomiting, acute onset,
history of the same without diagnosis

EXAM:
CT ABDOMEN AND PELVIS WITH CONTRAST
TECHNIQUE: Multidetector CT imaging of the abdomen and pelvis was performed
using the standard protocol following bolus administration of
intravenous contrast.
CONTRAST:  125mL OMNIPAQUE IOHEXOL 300 MG/ML  SOLN

[Series 2: routine abd/pel with · axial · 0.86mm/px · z∈[-1240,-750]mm · 12 of 110 slices shown, 14 images]
[im 6/110  soft-tissue]
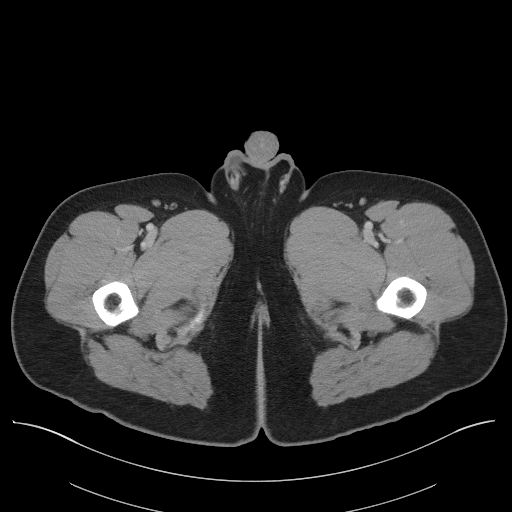
[im 6/110  bone]
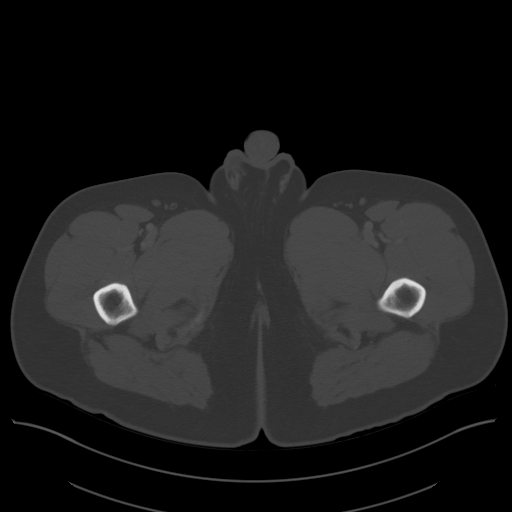
[im 16/110  soft-tissue]
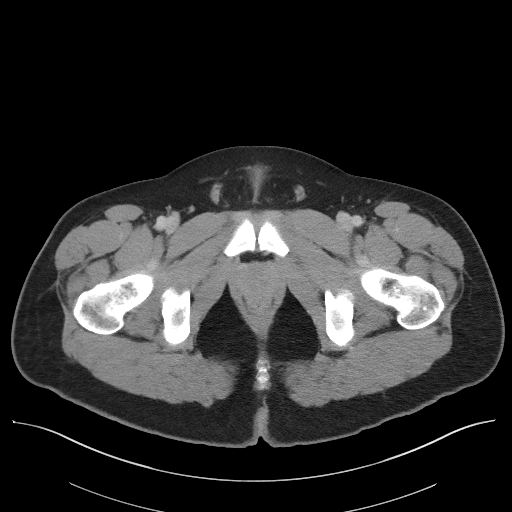
[im 26/110  soft-tissue]
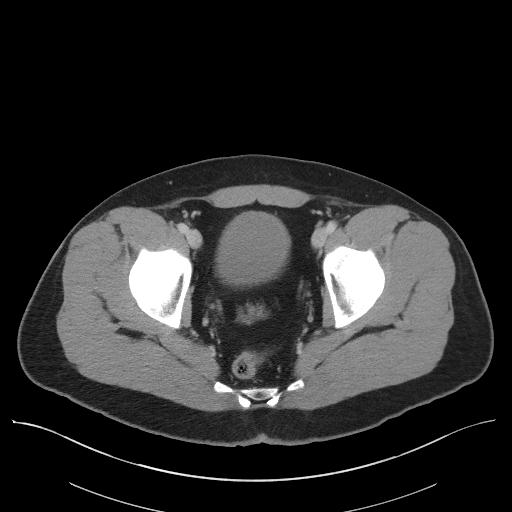
[im 32/110  soft-tissue]
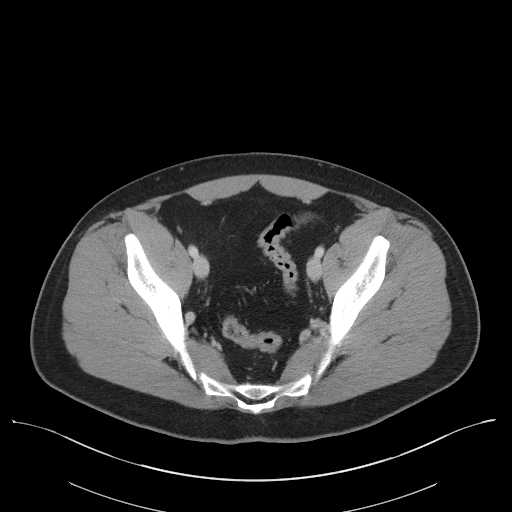
[im 42/110  soft-tissue]
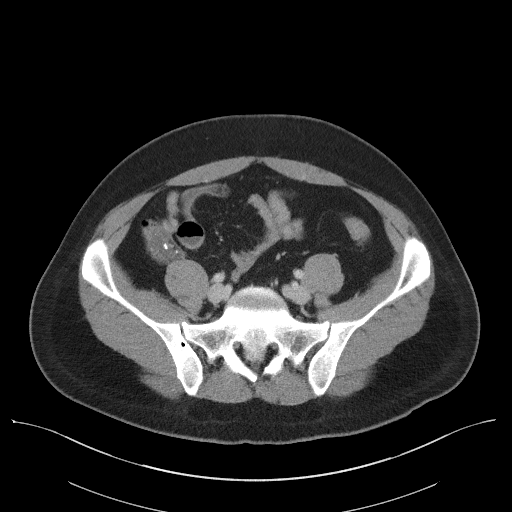
[im 52/110  soft-tissue]
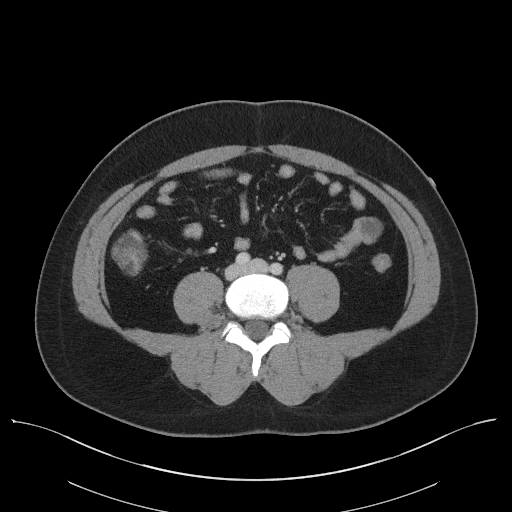
[im 58/110  soft-tissue]
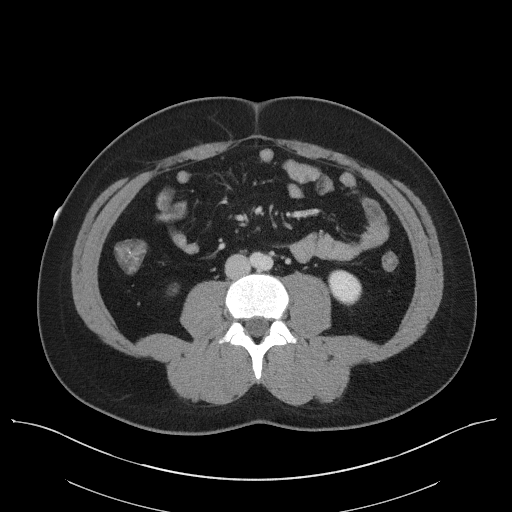
[im 68/110  soft-tissue]
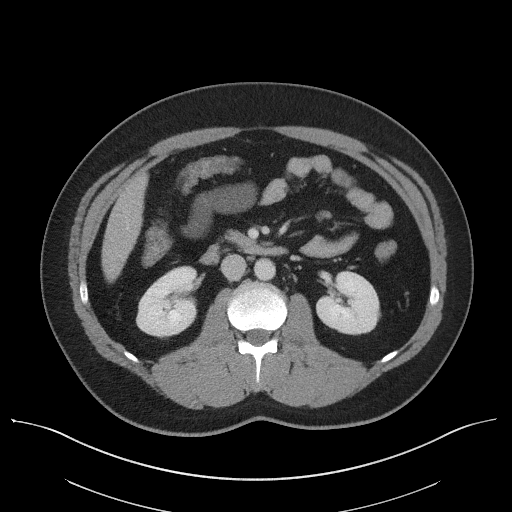
[im 78/110  soft-tissue]
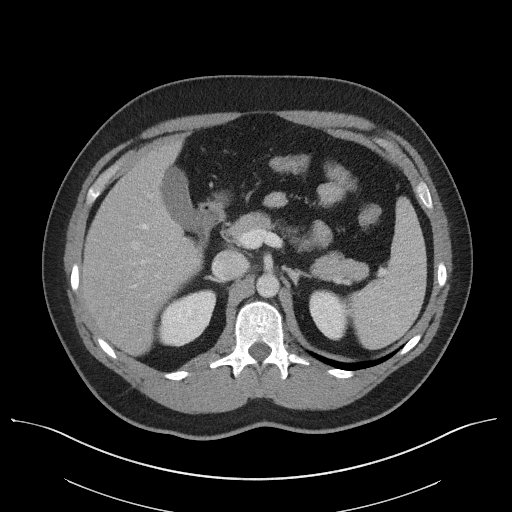
[im 78/110  bone]
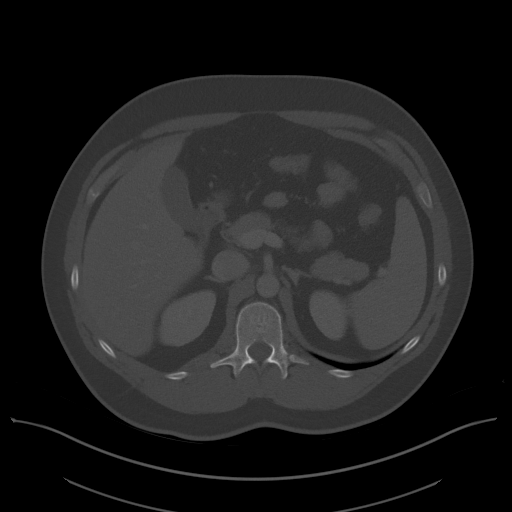
[im 84/110  soft-tissue]
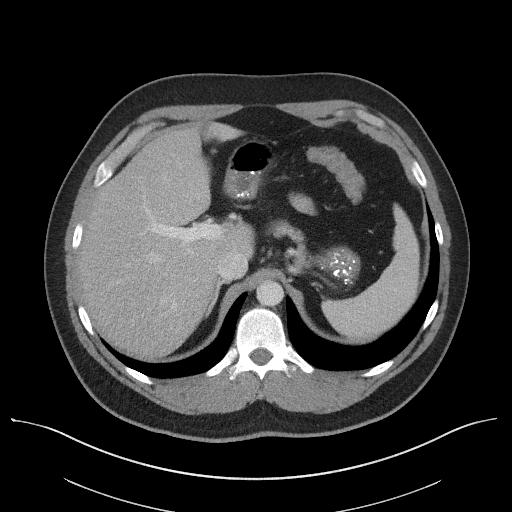
[im 94/110  soft-tissue]
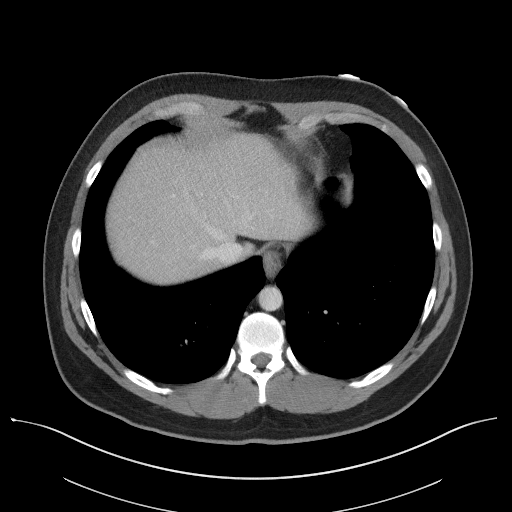
[im 104/110  soft-tissue]
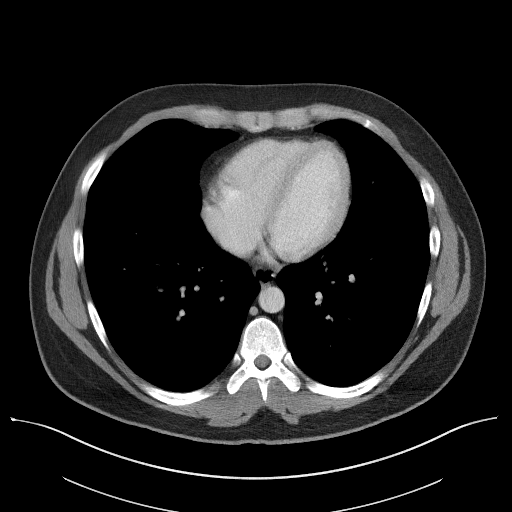

[Series 5: coronal st · coronal · 0.77mm/px · 3 of 109 slices shown]
[im 37/109  soft-tissue]
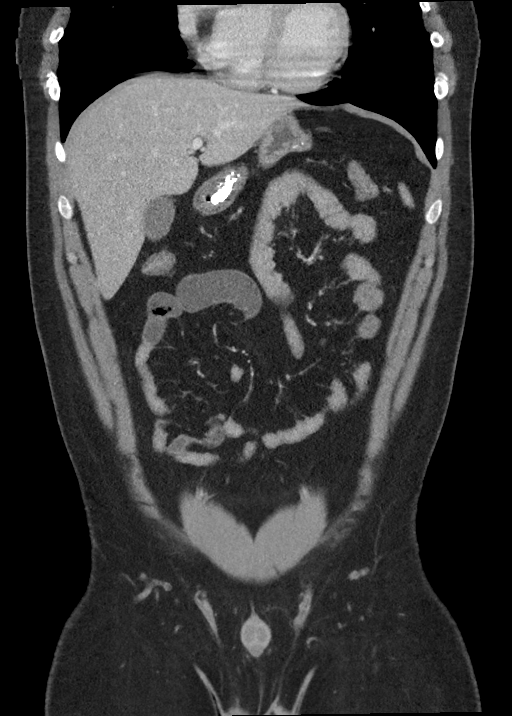
[im 49/109  soft-tissue]
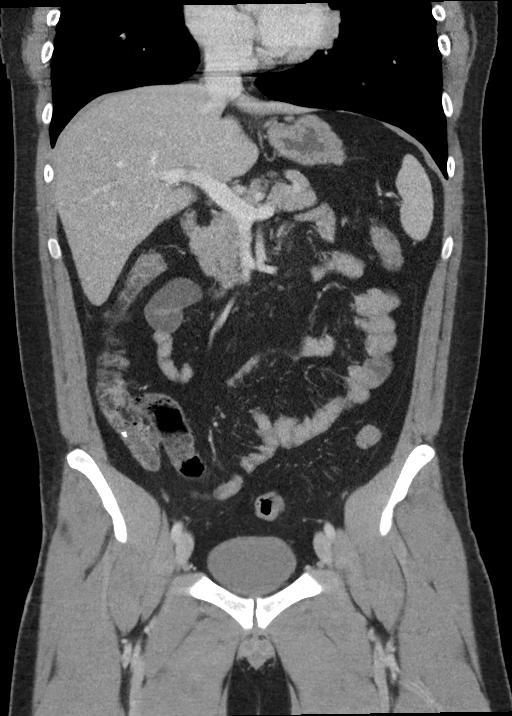
[im 61/109  soft-tissue]
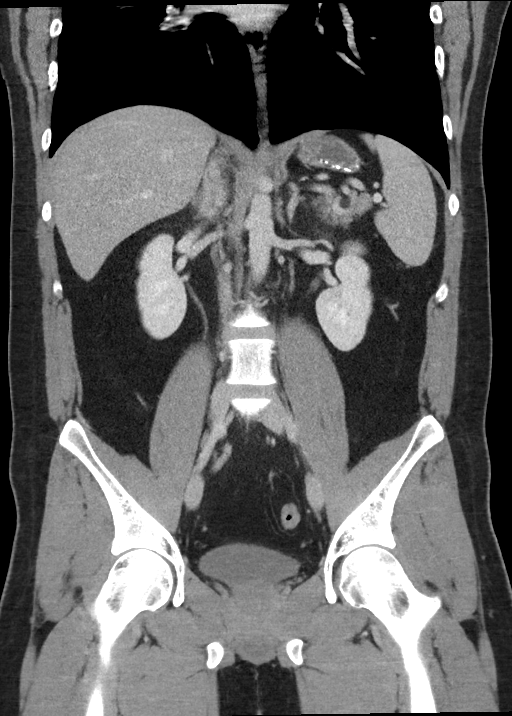

[15 of 46 positions shown; findings below may reference images not displayed]

FINDINGS: Lower chest: Lung bases are clear. Normal heart size. No pericardial
effusion.

Hepatobiliary: Focal fatty infiltration at the falciform ligament.
No worrisome focal liver lesions. Smooth liver surface contour.
Normal hepatic attenuation. Normal gallbladder and biliary tree
without visible calcified gallstone or biliary ductal dilatation.

Pancreas: No pancreatic ductal dilatation or surrounding
inflammatory changes.

Spleen: Normal in size. No concerning splenic lesions.

Adrenals/Urinary Tract: Normal adrenal glands. Kidneys are normally
located with symmetric enhancement. No suspicious renal lesion,
urolithiasis or hydronephrosis. Urinary bladder is unremarkable for
the degree of distention.

Stomach/Bowel: Distal esophagus, stomach and duodenum are
unremarkable. Some high attenuation material within the stomach and
cecum, could reflect ingestion of bismuth containing products. No
large or small bowel thickening or dilatation accounting for
underdistention. A normal appendix is visualized. No evidence of
obstruction.

Vascular/Lymphatic: No significant vascular findings are present. No
enlarged abdominal or pelvic lymph nodes.

Reproductive: The prostate and seminal vesicles are unremarkable.

Other: No abdominopelvic free fluid or free gas. No bowel containing
hernias.

Musculoskeletal: No acute osseous abnormality or suspicious osseous
lesion.
IMPRESSION: 1. No acute intra-abdominal process to provide cause for patient's
symptoms.
2. Some high attenuation material within the stomach and cecum,
could reflect ingestion of bismuth containing products (e.g.
Pepto-Bismol).

## 2020-10-04 MED ORDER — DICYCLOMINE HCL 20 MG PO TABS: 20.0000 mg | ORAL_TABLET | Freq: Three times a day (TID) | ORAL | 0 refills | Status: DC | PRN

## 2020-10-04 MED ORDER — SODIUM CHLORIDE 0.9 % IV BOLUS (SEPSIS)
1000.0000 mL | Freq: Once | INTRAVENOUS | Status: AC
  Administered 2020-10-04: 1000 mL via INTRAVENOUS

## 2020-10-04 MED ORDER — ONDANSETRON 4 MG PO TBDP: 4.0000 mg | ORAL_TABLET | Freq: Four times a day (QID) | ORAL | 0 refills | Status: DC | PRN

## 2020-10-04 MED ORDER — IOHEXOL 300 MG/ML  SOLN
125.0000 mL | Freq: Once | INTRAMUSCULAR | Status: AC | PRN
  Administered 2020-10-04: 125 mL via INTRAVENOUS

## 2020-10-04 MED ORDER — HALOPERIDOL LACTATE 5 MG/ML IJ SOLN: 5.0000 mg | Freq: Once | INTRAMUSCULAR | Status: DC

## 2020-10-04 MED ORDER — HYDROMORPHONE HCL 1 MG/ML IJ SOLN
1.0000 mg | Freq: Once | INTRAMUSCULAR | Status: AC
  Administered 2020-10-04: 1 mg via INTRAVENOUS
  Filled 2020-10-04: qty 1

## 2020-10-04 MED ORDER — ONDANSETRON HCL 4 MG/2ML IJ SOLN
4.0000 mg | Freq: Once | INTRAMUSCULAR | Status: AC
  Administered 2020-10-04: 4 mg via INTRAVENOUS
  Filled 2020-10-04: qty 2

## 2020-10-04 NOTE — Discharge Instructions (Addendum)
You may alternate Tylenol 1000 mg every 6 hours as needed for pain, fever and Ibuprofen 800 mg every 8 hours as needed for pain, fever.  Please take Ibuprofen with food.  Do not take more than 4000 mg of Tylenol (acetaminophen) in a 24 hour period.   You may use over-the-counter Imodium as needed for diarrhea. 

## 2020-10-04 NOTE — ED Notes (Signed)
Patient transported to CT 

## 2020-10-04 NOTE — ED Notes (Signed)
Pt has asked several times for water to drink; explained to pt to remain NPO due to his N/V; since pt has been noted going back & forth to lobby BR then coming back to his seat and sticking his fingers down his throat and wretching loudly

## 2020-10-04 NOTE — ED Provider Notes (Addendum)
[facility] Emergency Department Provider Note  ____________________________________________   Event Date/Time   First MD Initiated Contact with Patient 10/04/20 0300     (approximate)  I have reviewed the triage vital signs and the nursing notes.   HISTORY  Chief Complaint Abdominal Pain    HPI Bernard Benton is a 25 y.o. male with history of acid reflux who presents to the emergency department with upper sharp severe abdominal pain without radiation, nausea, vomiting and diarrhea that started yesterday afternoon.  Reports he has had similar symptoms before in February 2022.  No known fevers.  No dysuria or hematuria.  No known history of pancreatitis, gallstones.  States he does not drink alcohol.  No history of hyperemesis cannabinoid syndrome.  Reports that he stopped smoking marijuana 2 weeks ago because he is trying to find a job.  No previous abdominal surgeries.        Past Medical History:  Diagnosis Date  . Acid reflux   . Depressed     There are no problems to display for this patient.   Past Surgical History:  Procedure Laterality Date  . HIP SURGERY      Prior to Admission medications   Medication Sig Start Date End Date Taking? Authorizing Provider  dicyclomine (BENTYL) 20 MG tablet Take 1 tablet (20 mg total) by mouth every 8 (eight) hours as needed for spasms (Abdominal cramping). 10/04/20  Yes Amatullah Christy, Baxter Hire N, DO  ondansetron (ZOFRAN ODT) 4 MG disintegrating tablet Take 1 tablet (4 mg total) by mouth every 6 (six) hours as needed for nausea or vomiting. 10/04/20  Yes Matty Deamer, Layla Maw, DO  metoCLOPramide (REGLAN) 10 MG tablet Take 1 tablet (10 mg total) by mouth 4 (four) times daily -  before meals and at bedtime. 08/28/15   Sharman Cheek, MD  promethazine (PHENERGAN) 25 MG tablet Take 1 tablet (25 mg total) by mouth every 6 (six) hours as needed for nausea or vomiting. 09/05/20   Minna Antis, MD  ranitidine (ZANTAC) 150 MG  capsule Take 1 capsule (150 mg total) by mouth 2 (two) times daily. 08/28/15   Sharman Cheek, MD  sucralfate (CARAFATE) 1 g tablet Take 1 tablet (1 g total) by mouth 4 (four) times daily. 08/28/15   Sharman Cheek, MD    Allergies Other  No family history on file.  Social History Social History   Tobacco Use  . Smoking status: Former Games developer  . Smokeless tobacco: Never Used  Vaping Use  . Vaping Use: Never used  Substance Use Topics  . Alcohol use: Yes    Comment: occassionally  . Drug use: Yes    Types: Marijuana    Comment: occassional    Review of Systems Constitutional: No fever. Eyes: No visual changes. ENT: No sore throat. Cardiovascular: Denies chest pain. Respiratory: Denies shortness of breath. Gastrointestinal: + nausea, vomiting, diarrhea. Genitourinary: Negative for dysuria. Musculoskeletal: Negative for back pain. Skin: Negative for rash. Neurological: Negative for focal weakness or numbness.  ____________________________________________   PHYSICAL EXAM:  VITAL SIGNS: ED Triage Vitals  Enc Vitals Group     BP 10/03/20 2241 (!) 113/95     Pulse Rate 10/03/20 2241 65     Resp 10/03/20 2241 20     Temp 10/03/20 2241 98.4 F (36.9 C)     Temp Source 10/03/20 2241 Oral     SpO2 10/03/20 2237 100 %     Weight 10/03/20 2241 235 lb (106.6 kg)  Height 10/03/20 2241 6\' 2"  (1.88 m)     Head Circumference --      Peak Flow --      Pain Score 10/03/20 2241 2     Pain Loc --      Pain Edu? --      Excl. in GC? --    CONSTITUTIONAL: Alert and oriented and responds appropriately to questions.  Appears pale, afebrile, actively vomiting HEAD: Normocephalic EYES: Conjunctivae clear, pupils appear equal, EOM appear intact ENT: normal nose; moist mucous membranes NECK: Supple, normal ROM CARD: RRR; S1 and S2 appreciated; no murmurs, no clicks, no rubs, no gallops RESP: Normal chest excursion without splinting or tachypnea; breath sounds clear and  equal bilaterally; no wheezes, no rhonchi, no rales, no hypoxia or respiratory distress, speaking full sentences ABD/GI: Normal bowel sounds; non-distended; soft, diffusely tender throughout the upper abdomen with guarding, no rebound, no tenderness at McBurney's point, negative Murphy sign BACK: The back appears normal EXT: Normal ROM in all joints; no deformity noted, no edema; no cyanosis SKIN: Normal color for age and race; warm; no rash on exposed skin NEURO: Moves all extremities equally PSYCH: The patient's mood and manner are appropriate.  ____________________________________________   LABS (all labs ordered are listed, but only abnormal results are displayed)  Labs Reviewed  CBC - Abnormal; Notable for the following components:      Result Value   WBC 18.1 (*)    All other components within normal limits  COMPREHENSIVE METABOLIC PANEL - Abnormal; Notable for the following components:   CO2 21 (*)    Glucose, Bld 119 (*)    Total Protein 8.3 (*)    All other components within normal limits  LIPASE, BLOOD  URINALYSIS, COMPLETE (UACMP) WITH MICROSCOPIC  TROPONIN I (HIGH SENSITIVITY)  TROPONIN I (HIGH SENSITIVITY)   ____________________________________________  EKG   EKG Interpretation  Date/Time:  Monday October 03 2020 22:43:09 EDT Ventricular Rate:  61 PR Interval:  134 QRS Duration: 88 QT Interval:  400 QTC Calculation: 402 R Axis:   71 Text Interpretation: Normal sinus rhythm Normal ECG Confirmed by [id number] [phone]) on 10/04/2020 3:15:41 AM       ____________________________________________  RADIOLOGY 10/06/2020 Joseangel Nettleton, personally viewed and evaluated these images (plain radiographs) as part of my medical decision making, as well as reviewing the written report by the radiologist.  ED MD interpretation:  none  Official radiology report(s): CT ABDOMEN PELVIS W CONTRAST  Result Date: 10/04/2020 CLINICAL DATA:  Left flank pain, nausea vomiting, acute  onset, history of the same without diagnosis EXAM: CT ABDOMEN AND PELVIS WITH CONTRAST TECHNIQUE: Multidetector CT imaging of the abdomen and pelvis was performed using the standard protocol following bolus administration of intravenous contrast. CONTRAST:  10/06/2020 OMNIPAQUE IOHEXOL 300 MG/ML  SOLN COMPARISON:  None. FINDINGS: Lower chest: Lung bases are clear. Normal heart size. No pericardial effusion. Hepatobiliary: Focal fatty infiltration at the falciform ligament. No worrisome focal liver lesions. Smooth liver surface contour. Normal hepatic attenuation. Normal gallbladder and biliary tree without visible calcified gallstone or biliary ductal dilatation. Pancreas: No pancreatic ductal dilatation or surrounding inflammatory changes. Spleen: Normal in size. No concerning splenic lesions. Adrenals/Urinary Tract: Normal adrenal glands. Kidneys are normally located with symmetric enhancement. No suspicious renal lesion, urolithiasis or hydronephrosis. Urinary bladder is unremarkable for the degree of distention. Stomach/Bowel: Distal esophagus, stomach and duodenum are unremarkable. Some high attenuation material within the stomach and cecum, could reflect ingestion of bismuth containing products. No large  or small bowel thickening or dilatation accounting for underdistention. A normal appendix is visualized. No evidence of obstruction. Vascular/Lymphatic: No significant vascular findings are present. No enlarged abdominal or pelvic lymph nodes. Reproductive: The prostate and seminal vesicles are unremarkable. Other: No abdominopelvic free fluid or free gas. No bowel containing hernias. Musculoskeletal: No acute osseous abnormality or suspicious osseous lesion. IMPRESSION: 1. No acute intra-abdominal process to provide cause for patient's symptoms. 2. Some high attenuation material within the stomach and cecum, could reflect ingestion of bismuth containing products (e.g. Pepto-Bismol). Electronically Signed   By:  Kreg ShropshirePrice  DeHay M.D.   On: 10/04/2020 04:48    ____________________________________________   PROCEDURES  Procedure(s) performed (including Critical Care):  Procedures   ____________________________________________   INITIAL IMPRESSION / ASSESSMENT AND PLAN / ED COURSE  As part of my medical decision making, I reviewed the following data within the electronic MEDICAL RECORD NUMBER Nursing notes reviewed and incorporated, Labs reviewed and CT reviewed, Old chart reviewed and Notes from prior ED visits         Patient here with abdominal pain, nausea, vomiting diarrhea.  Differential includes gastroenteritis, cholelithiasis, cholecystitis, pancreatitis, colitis.  Less likely appendicitis.  Labs show leukocytosis of 18,000 but otherwise unremarkable.  Will obtain CT scan.  Will give IV fluids, nausea and pain medicine.  ED PROGRESS  Patient CT scan shows no acute abnormality.  He reports feeling "much better" and tolerating p.o. after medications.  Urinalysis shows no sign of infection but does show large ketones.  He has received 2 L of IV fluids in the emergency department and is tolerating p.o. fluids.  Will discharge home with prescriptions of Zofran, Bentyl.  Recommended bland diet for the next several days.  Discussed return precautions.    Patient requesting a GI referral given he reports that he has had this happen to him several times.  Have encouraged him to continue abstaining from marijuana.  At this time, I do not feel there is any life-threatening condition present. I have reviewed, interpreted and discussed all results (EKG, imaging, lab, urine as appropriate) and exam findings with patient/family. I have reviewed nursing notes and appropriate previous records.  I feel the patient is safe to be discharged home without further emergent workup and can continue workup as an outpatient as needed. Discussed usual and customary return precautions. Patient/family verbalize understanding  and are comfortable with this plan.  Outpatient follow-up has been provided as needed. All questions have been answered.  ____________________________________________   FINAL CLINICAL IMPRESSION(S) / ED DIAGNOSES  Final diagnoses:  Nausea vomiting and diarrhea     ED Discharge Orders         Ordered    ondansetron (ZOFRAN ODT) 4 MG disintegrating tablet  Every 6 hours PRN        10/04/20 0630    dicyclomine (BENTYL) 20 MG tablet  Every 8 hours PRN        10/04/20 0630          *Please note:  Dema SeverinCody A Pracht was evaluated in Emergency Department on 10/04/2020 for the symptoms described in the history of present illness. He was evaluated in the context of the global COVID-19 pandemic, which necessitated consideration that the patient might be at risk for infection with the SARS-CoV-2 virus that causes COVID-19. Institutional protocols and algorithms that pertain to the evaluation of patients at risk for COVID-19 are in a state of rapid change based on information released by regulatory bodies including the CDC and federal  and state organizations. These policies and algorithms were followed during the patient's care in the ED.  Some ED evaluations and interventions may be delayed as a result of limited staffing during and the pandemic.*   Note:  This document was prepared using Dragon voice recognition software and may include unintentional dictation errors.   Rosell Khouri, Layla Maw, DO 10/04/20 [id number]    Khylin Gutridge, Layla Maw, DO 10/04/20 [phone]

## 2022-11-08 ENCOUNTER — Ambulatory Visit: Payer: Self-pay | Admitting: Physician Assistant

## 2023-05-14 ENCOUNTER — Other Ambulatory Visit: Payer: Self-pay

## 2023-05-14 ENCOUNTER — Emergency Department
Admission: EM | Admit: 2023-05-14 | Discharge: 2023-05-15 | Disposition: A | Discharge status: Other Acute Inpatient Hospital | Payer: Self-pay | Attending: Emergency Medicine | Admitting: Emergency Medicine

## 2023-05-14 ENCOUNTER — Encounter: Payer: Self-pay | Admitting: *Deleted

## 2023-05-14 DIAGNOSIS — R45851 Suicidal ideations: Secondary | ICD-10-CM

## 2023-05-14 DIAGNOSIS — Z87891 Personal history of nicotine dependence: Secondary | ICD-10-CM | POA: Insufficient documentation

## 2023-05-14 DIAGNOSIS — F339 Major depressive disorder, recurrent, unspecified: Secondary | ICD-10-CM

## 2023-05-14 DIAGNOSIS — F329 Major depressive disorder, single episode, unspecified: Secondary | ICD-10-CM | POA: Insufficient documentation

## 2023-05-14 LAB — CBC
HCT: 46.4 % (ref 39.0–52.0)
Hemoglobin: 15.8 g/dL (ref 13.0–17.0)
MCH: 30.6 pg (ref 26.0–34.0)
MCHC: 34.1 g/dL (ref 30.0–36.0)
MCV: 89.7 fL (ref 80.0–100.0)
Platelets: 297 10*3/uL (ref 150–400)
RBC: 5.17 MIL/uL (ref 4.22–5.81)
RDW: 12 % (ref 11.5–15.5)
WBC: 13.3 10*3/uL — ABNORMAL HIGH (ref 4.0–10.5)
nRBC: 0 % (ref 0.0–0.2)

## 2023-05-14 LAB — ETHANOL: Alcohol, Ethyl (B): <10 mg/dL (ref <10)

## 2023-05-14 LAB — SALICYLATE LEVEL: Salicylate Lvl: <7 mg/dL — ABNORMAL LOW (ref 7.0–30.0)

## 2023-05-14 LAB — ACETAMINOPHEN LEVEL: Acetaminophen (Tylenol), Serum: <10 ug/mL — ABNORMAL LOW (ref 10–30)

## 2023-05-14 LAB — URINE DRUG SCREEN, QUALITATIVE (ARMC ONLY)
Amphetamines, Ur Screen: NOT DETECTED
Barbiturates, Ur Screen: NOT DETECTED
Benzodiazepine, Ur Scrn: NOT DETECTED
Cannabinoid 50 Ng, Ur ~~LOC~~: POSITIVE — AB
Cocaine Metabolite,Ur ~~LOC~~: NOT DETECTED
MDMA (Ecstasy)Ur Screen: NOT DETECTED
Methadone Scn, Ur: NOT DETECTED
Opiate, Ur Screen: NOT DETECTED
Phencyclidine (PCP) Ur S: NOT DETECTED
Tricyclic, Ur Screen: NOT DETECTED

## 2023-05-14 LAB — COMPREHENSIVE METABOLIC PANEL
ALT: 31 U/L (ref 0–44)
AST: 23 U/L (ref 15–41)
Albumin: 4.9 g/dL (ref 3.5–5.0)
Alkaline Phosphatase: 89 U/L (ref 38–126)
Anion gap: 11 (ref 5–15)
BUN: 11 mg/dL (ref 6–20)
CO2: 27 mmol/L (ref 22–32)
Calcium: 9.4 mg/dL (ref 8.9–10.3)
Chloride: 100 mmol/L (ref 98–111)
Creatinine, Ser: 0.78 mg/dL (ref 0.61–1.24)
GFR, Estimated: >60 mL/min (ref >60)
Glucose, Bld: 107 mg/dL — ABNORMAL HIGH (ref 70–99)
Potassium: 3.7 mmol/L (ref 3.5–5.1)
Sodium: 138 mmol/L (ref 135–145)
Total Bilirubin: 0.6 mg/dL (ref 0.3–1.2)
Total Protein: 9.1 g/dL — ABNORMAL HIGH (ref 6.5–8.1)

## 2023-05-14 MED ORDER — NICOTINE 21 MG/24HR TD PT24: 21.0000 mg | MEDICATED_PATCH | Freq: Every day | TRANSDERMAL | Status: DC

## 2023-05-14 MED ORDER — NICOTINE 21 MG/24HR TD PT24
21.0000 mg | MEDICATED_PATCH | Freq: Every day | TRANSDERMAL | Status: DC
  Administered 2023-05-14 – 2023-05-15 (×2): 21 mg via TRANSDERMAL
  Filled 2023-05-14 (×2): qty 1
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
## 2023-05-14 NOTE — ED Notes (Addendum)
Black pants [employer] Cell phone Vape pen Black belt [employer] Gray tee shirt Black socks Blue underwear  Black wedding band

## 2023-05-14 NOTE — ED Provider Notes (Signed)
[facility] Provider Note    Event Date/Time   First MD Initiated Contact with Patient 05/14/23 2226     (approximate)   History   Suicidal   HPI  Bernard Benton is a 27 year old male presenting to the emergency room for evaluation of depression and suicidal thoughts.  Reports he has had ongoing issues with emotional regulation at home, but is able to control his feelings at work.  He will say things and then feels bad about it leading him to feel remorseful.  Triage note reports that patient thought about shooting himself today, was able to be talked down.  Tells me he currently does not have any suicidal thoughts and these thoughts are typically brief following an argument.  Denies HI or AVH.     Physical Exam   Triage Vital Signs: ED Triage Vitals  Encounter Vitals Group     BP 05/14/23 2207 (!) 143/96     Systolic BP Percentile --      Diastolic BP Percentile --      Pulse Rate 05/14/23 2207 60     Resp 05/14/23 2207 20     Temp 05/14/23 2207 98.4 F (36.9 C)     Temp Source 05/14/23 2207 Oral     SpO2 05/14/23 2207 98 %     Weight 05/14/23 2204 245 lb (111.1 kg)     Height 05/14/23 2204 6\' 2"  (1.88 m)     Head Circumference --      Peak Flow --      Pain Score 05/14/23 2204 0     Pain Loc --      Pain Education --      Exclude from Growth Chart --     Most recent vital signs: Vitals:   05/14/23 2207  BP: (!) 143/96  Pulse: 60  Resp: 20  Temp: 98.4 F (36.9 C)  SpO2: 98%     General: Awake, interactive  CV:  Regular rate, good peripheral perfusion.  Resp:  Unlabored respirations.  Abd:  Nondistended.  Neuro:  Symmetric facial movement, fluid speech   ED Results / Procedures / Treatments   Labs (all labs ordered are listed, but only abnormal results are displayed) Labs Reviewed  COMPREHENSIVE METABOLIC PANEL - Abnormal; Notable for the following components:      Result Value   Glucose, Bld 107 (*)    Total Protein 9.1  (*)    All other components within normal limits  SALICYLATE LEVEL - Abnormal; Notable for the following components:   Salicylate Lvl <7.0 (*)    All other components within normal limits  ACETAMINOPHEN LEVEL - Abnormal; Notable for the following components:   Acetaminophen (Tylenol), Serum <10 (*)    All other components within normal limits  CBC - Abnormal; Notable for the following components:   WBC 13.3 (*)    All other components within normal limits  URINE DRUG SCREEN, QUALITATIVE ([facility] ONLY) - Abnormal; Notable for the following components:   Cannabinoid 50 Ng, Ur  POSITIVE (*)    All other components within normal limits  ETHANOL     EKG EKG independently reviewed interpreted by myself (ER attending) demonstrates:    RADIOLOGY Imaging independently reviewed and interpreted by myself demonstrates:    PROCEDURES:  Critical Care performed: No  Procedures   MEDICATIONS ORDERED IN ED: Medications  nicotine (NICODERM CQ - dosed in mg/24 hours) patch 21 mg (21 mg Transdermal Patch Applied 05/14/23 2342)  IMPRESSION / MDM / ASSESSMENT AND PLAN / ED COURSE  I reviewed the triage vital signs and the nursing notes.  Differential diagnosis includes, but is not limited to, primary psychiatric disorder, substance-induced mood disorder, acute stress response  Patient's presentation is most consistent with acute presentation with potential threat to life or bodily function.  27 year old male presenting to the emergency department for with depression and suicidal ideation.  Denies active SI or plan at the time of my evaluation.  Is interested in psychiatric evaluation and treatment.  With this, do think it is reasonable to hold off on IVC.  Psychiatry and TTS consulted.  The patient has been placed in psychiatric observation due to the need to provide a safe environment for the patient while obtaining psychiatric consultation and evaluation, as well as ongoing medical and  medication management to treat the patient's condition.  The patient has not been placed under full IVC at this time.       FINAL CLINICAL IMPRESSION(S) / ED DIAGNOSES   Final diagnoses:  Suicidal ideation     Rx / DC Orders   ED Discharge Orders     None        Note:  This document was prepared using Dragon voice recognition software and may include unintentional dictation errors.   Trinna Post, MD 05/15/23 [phone]

## 2023-05-14 NOTE — ED Triage Notes (Signed)
Pt ambulatory to triage.  Pt reports feeling depressed.  Pt was brought in by [employer]  pt is Voluntary.   Pt reports he almost shot himself today but his wife talked him down.  Pt reports marijuana use.  Denies etoh use.  Pt calm and cooperative.

## 2023-05-15 ENCOUNTER — Inpatient Hospital Stay
Admission: AD | Admit: 2023-05-15 | Discharge: 2023-05-20 | DRG: 881 | Disposition: A | Discharge status: Home / Self Care | Payer: 59 | Source: Intra-hospital | Attending: Psychiatry | Admitting: Psychiatry

## 2023-05-15 ENCOUNTER — Other Ambulatory Visit: Payer: Self-pay

## 2023-05-15 ENCOUNTER — Encounter: Payer: Self-pay | Admitting: Psychiatry

## 2023-05-15 DIAGNOSIS — F39 Unspecified mood [affective] disorder: Principal | ICD-10-CM | POA: Diagnosis present

## 2023-05-15 DIAGNOSIS — F4310 Post-traumatic stress disorder, unspecified: Secondary | ICD-10-CM | POA: Diagnosis present

## 2023-05-15 DIAGNOSIS — Z818 Family history of other mental and behavioral disorders: Secondary | ICD-10-CM | POA: Diagnosis not present

## 2023-05-15 DIAGNOSIS — Z87891 Personal history of nicotine dependence: Secondary | ICD-10-CM

## 2023-05-15 DIAGNOSIS — R45851 Suicidal ideations: Secondary | ICD-10-CM

## 2023-05-15 DIAGNOSIS — F32A Depression, unspecified: Principal | ICD-10-CM | POA: Diagnosis present

## 2023-05-15 DIAGNOSIS — F339 Major depressive disorder, recurrent, unspecified: Secondary | ICD-10-CM

## 2023-05-15 HISTORY — DX: Suicidal ideations: R45.851

## 2023-05-15 MED ORDER — DIPHENHYDRAMINE HCL 25 MG PO CAPS: 50.0000 mg | ORAL_CAPSULE | Freq: Three times a day (TID) | ORAL | Status: DC | PRN

## 2023-05-15 MED ORDER — HYDROXYZINE HCL 25 MG PO TABS: 25.0000 mg | ORAL_TABLET | Freq: Three times a day (TID) | ORAL | Status: DC | PRN

## 2023-05-15 MED ORDER — TRAZODONE HCL 50 MG PO TABS
50.0000 mg | ORAL_TABLET | Freq: Every evening | ORAL | Status: DC | PRN
  Administered 2023-05-15 – 2023-05-19 (×4): 50 mg via ORAL
  Filled 2023-05-15 (×4): qty 1

## 2023-05-15 MED ORDER — NICOTINE 21 MG/24HR TD PT24
21.0000 mg | MEDICATED_PATCH | Freq: Every day | TRANSDERMAL | Status: DC
  Administered 2023-05-16: 21 mg via TRANSDERMAL
  Filled 2023-05-15 (×2): qty 1

## 2023-05-15 MED ORDER — ALUM & MAG HYDROXIDE-SIMETH 200-200-20 MG/5ML PO SUSP
30.0000 mL | ORAL | Status: DC | PRN
  Administered 2023-05-19 – 2023-05-20 (×2): 30 mL via ORAL
  Filled 2023-05-15 (×2): qty 30

## 2023-05-15 MED ORDER — ACETAMINOPHEN 325 MG PO TABS
650.0000 mg | ORAL_TABLET | Freq: Four times a day (QID) | ORAL | Status: DC | PRN
  Administered 2023-05-16: 650 mg via ORAL
  Filled 2023-05-15: qty 2

## 2023-05-15 MED ORDER — DIPHENHYDRAMINE HCL 50 MG/ML IJ SOLN: 50.0000 mg | Freq: Three times a day (TID) | INTRAMUSCULAR | Status: DC | PRN

## 2023-05-15 MED ORDER — HALOPERIDOL LACTATE 5 MG/ML IJ SOLN: 5.0000 mg | Freq: Three times a day (TID) | INTRAMUSCULAR | Status: DC | PRN

## 2023-05-15 MED ORDER — LORAZEPAM 2 MG PO TABS: 2.0000 mg | ORAL_TABLET | Freq: Three times a day (TID) | ORAL | Status: DC | PRN

## 2023-05-15 MED ORDER — NICOTINE 21 MG/24HR TD PT24
21.0000 mg | MEDICATED_PATCH | Freq: Once | TRANSDERMAL | Status: AC
  Administered 2023-05-15: 21 mg via TRANSDERMAL
  Filled 2023-05-15: qty 1

## 2023-05-15 MED ORDER — HALOPERIDOL 5 MG PO TABS: 5.0000 mg | ORAL_TABLET | Freq: Three times a day (TID) | ORAL | Status: DC | PRN

## 2023-05-15 MED ORDER — ARIPIPRAZOLE 10 MG PO TABS: 5.0000 mg | ORAL_TABLET | Freq: Every day | ORAL | Status: DC

## 2023-05-15 MED ORDER — LORAZEPAM 2 MG/ML IJ SOLN: 2.0000 mg | Freq: Three times a day (TID) | INTRAMUSCULAR | Status: DC | PRN

## 2023-05-15 MED ORDER — ARIPIPRAZOLE 5 MG PO TABS
5.0000 mg | ORAL_TABLET | Freq: Every day | ORAL | Status: DC
  Administered 2023-05-15: 5 mg via ORAL
  Filled 2023-05-15: qty 1

## 2023-05-15 MED ORDER — MAGNESIUM HYDROXIDE 400 MG/5ML PO SUSP: 30.0000 mL | Freq: Every day | ORAL | Status: DC | PRN

## 2023-05-15 NOTE — ED Provider Notes (Signed)
-----------------------------------------   4:50 AM on 05/15/2023 -----------------------------------------   Patient evaluated by overnight psychiatric NP who recommends inpatient hospitalization.   Irean Hong, MD 05/15/23 [phone]
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
## 2023-05-15 NOTE — Group Note (Signed)
Date:  05/15/2023 Time:  6:59 PM  Group Topic/Focus:  Wellness Toolbox:   The focus of this group is to discuss various aspects of wellness, balancing those aspects and exploring ways to increase the ability to experience wellness.  Patients will create a wellness toolbox for use upon discharge.    Participation Level:  Active  Participation Quality:  Appropriate  Affect:  Appropriate  Cognitive:  Appropriate  Insight: Appropriate  Engagement in Group:  Engaged  Modes of Intervention:  Activity  Additional Comments:    Wilford Corner 05/15/2023, 6:59 PM

## 2023-05-15 NOTE — Progress Notes (Signed)
Pt calm and pleasant during assessment denying SI/HI/AVH. Pt stated he was here to work on his depression. Pt compliant with medication administration per MD orders. Pt given education, support, and encouragement to be active in his treatment plan. Pt being monitored Q 15 minutes for safety per unit protocol, remains safe on the unit

## 2023-05-15 NOTE — Progress Notes (Signed)
27 year old male patient admitted for Mood disorder. Patient appears sad and tearful but cooperative with admission assessment. Patient states " I was struggling with depression. Fighting with wife for no reason. I have trouble regulating my emotions." Patient lives with his wife and 3 children. Patient denies SI,HI and AVH at this time. Patient states his goal is " understand what is my issue. Do the best for my family." Skin assessment and body search done. No contraband found. Oriented to unit and made patient comfortable. Support and encouragement given.

## 2023-05-15 NOTE — ED Notes (Signed)
Vol/consult done/Per NP Candis Shine pt admit to inpatient psych for safety & stabilization.

## 2023-05-15 NOTE — Group Note (Signed)
Date:  05/15/2023 Time:  9:34 PM  Group Topic/Focus:  Stages of Change:   The focus of this group is to explain the stages of change and help patients identify changes they want to make upon discharge.    Participation Level:  Active  Participation Quality:  Appropriate and Attentive  Affect:  Appropriate  Cognitive:  Alert and Appropriate  Insight: Appropriate and Good  Engagement in Group:  Developing/Improving and Engaged  Modes of Intervention:  Clarification, Discussion, Education, Rapport Building, Socialization, and Support  Additional Comments:     Marna Weniger 05/15/2023, 9:34 PM

## 2023-05-15 NOTE — ED Notes (Signed)
Pt given breakfast tray and beverage.  

## 2023-05-15 NOTE — ED Notes (Addendum)
Attempted to call report. Nurse to return call so report can be given and pt can be transported to admission bed.

## 2023-05-15 NOTE — ED Notes (Signed)
Pt given lunch tray and beverage 

## 2023-05-16 DIAGNOSIS — F39 Unspecified mood [affective] disorder: Secondary | ICD-10-CM

## 2023-05-16 DIAGNOSIS — F4310 Post-traumatic stress disorder, unspecified: Secondary | ICD-10-CM | POA: Diagnosis present

## 2023-05-16 MED ORDER — NICOTINE POLACRILEX 2 MG MT GUM
2.0000 mg | CHEWING_GUM | OROMUCOSAL | Status: DC | PRN
  Administered 2023-05-16 – 2023-05-18 (×8): 2 mg via ORAL
  Filled 2023-05-16 (×8): qty 1

## 2023-05-16 MED ORDER — ONDANSETRON 4 MG PO TBDP: 4.0000 mg | ORAL_TABLET | Freq: Three times a day (TID) | ORAL | Status: DC | PRN

## 2023-05-16 NOTE — Progress Notes (Addendum)
Patient denies SI, HI, and AVH. He is calm and cooperative with assessment. He said that he had a bad headache last night that has persisted into this morning, which he rated as a 7/10 at the time of assessment. Tylenol PRN was given. Patient also stated that he was nauseous. After breakfast, patient vomited in his room and said he could not keep water down. Gatorade provided and orders for nausea medication requested. Patient is compliant with scheduled medication.

## 2023-05-16 NOTE — Group Note (Signed)
Date:  05/16/2023 Time:  7:17 PM  Group Topic/Focus:  Activity Group:  The focus of the group is to encourage activity with the patients and have them go outside to the courtyard for some fresh air and some exercise.    Participation Level:  Active  Participation Quality:  Appropriate  Affect:  Appropriate  Cognitive:  Appropriate  Insight: Appropriate  Engagement in Group:  Engaged  Modes of Intervention:  Activity  Additional Comments:    Bernard Benton 05/16/2023, 7:17 PM

## 2023-05-16 NOTE — Group Note (Signed)
Date:  05/16/2023 Time:  6:39 PM  Group Topic/Focus:  Goals Group:   The focus of this group is to help patients establish daily goals to achieve during treatment and discuss how the patient can incorporate goal setting into their daily lives to aide in recovery.   Participation Level:  Did Not Attend   Miki Blank A Keisean Skowron 05/16/2023, 6:39 PM
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
## 2023-05-16 NOTE — Plan of Care (Signed)
Pt new to the unit today, hasn't had time to progress   Problem: Education: Goal: Knowledge of [facility] Education information/materials will improve Outcome: Not Progressing Goal: Emotional status will improve Outcome: Not Progressing Goal: Mental status will improve Outcome: Not Progressing Goal: Verbalization of understanding the information provided will improve Outcome: Not Progressing   Problem: Activity: Goal: Interest or engagement in activities will improve Outcome: Not Progressing Goal: Sleeping patterns will improve Outcome: Not Progressing   Problem: Coping: Goal: Ability to verbalize frustrations and anger appropriately will improve Outcome: Not Progressing Goal: Ability to demonstrate self-control will improve Outcome: Not Progressing   Problem: Health Behavior/Discharge Planning: Goal: Identification of resources available to assist in meeting health care needs will improve Outcome: Not Progressing Goal: Compliance with treatment plan for underlying cause of condition will improve Outcome: Not Progressing   Problem: Physical Regulation: Goal: Ability to maintain clinical measurements within normal limits will improve Outcome: Not Progressing   Problem: Safety: Goal: Periods of time without injury will increase Outcome: Not Progressing

## 2023-05-16 NOTE — Group Note (Signed)
Date:  05/16/2023 Time:  10:08 PM  Group Topic/Focus:  Making Healthy Choices:   The focus of this group is to help patients identify negative/unhealthy choices they were using prior to admission and identify positive/healthier coping strategies to replace them upon discharge.    Participation Level:  Active  Participation Quality:  Appropriate and Attentive  Affect:  Appropriate and Excited  Cognitive:  Alert and Appropriate  Insight: Appropriate, Good, and Improving  Engagement in Group:  Developing/Improving and Engaged  Modes of Intervention:  Activity, Clarification, Discussion, Education, Rapport Building, Role-play, Socialization, and Support  Additional Comments:     Bernard Benton 05/16/2023, 10:08 PM

## 2023-05-16 NOTE — Progress Notes (Signed)
Pt calm and pleasant during assessment denying SI/HI/AVH. Pt stated he was here to work on his depression. Pt didn't have any medications scheduled tonight and hasn't requested anything PRN as of now.  Pt given education, support, and encouragement to be active in his treatment plan. Pt being monitored Q 15 minutes for safety per unit protocol, remains safe on the unit

## 2023-05-16 NOTE — Group Note (Signed)
Recreation Therapy Group Note   Group Topic:Other  Group Date: 05/16/2023 Start Time: 1000 End Time: 1100 Facilitators: Rosina Lowenstein, LRT, CTRS Location:  Craft Room  Group Description: Sport and exercise psychologist. Pts were given a card with a Halloween term on it and asked to draw it out on the dry erase board, one at a time and without using words or sound, for the group to guess what they have drawn. Once the image was successfully drawn, the next person would draw their term on the board. 2 mummies were identified voluntarily from the group. The rest of the group was split into two groups. Pts were encouraged to wrap the 2 mummies from head to toe with a roll of toilet paper. The team to do so fastest and with the whole roll of toilet paper, wins.   Goal Area(s) Addressed:  Patient will increase communication skills. Patient will engage in a team-building activity.  Patient will build frustration tolerance skills.  Patient will gain knowledge of an emotional expression activity, like drawing.    Affect/Mood: N/A   Participation Level: Did not attend    Clinical Observations/Individualized Feedback: Jeremias did not attend group.   Plan: Continue to engage patient in RT group sessions 2-3x/week.   Rosina Lowenstein, LRT, CTRS 05/16/2023 11:30 AM

## 2023-05-16 NOTE — [facility] Suicide Risk Assessment (Signed)
[facility] Admission Suicide Risk Assessment   Nursing information obtained from:  Patient Demographic factors:  Male, Caucasian Current Mental Status:  NA Loss Factors:  Financial problems / change in socioeconomic status Historical Factors:  NA Risk Reduction Factors:  Responsible for children under 28 years of age, Living with another person, especially a relative  Principal Problem: Mood disorder (HCC) Diagnosis:  Principal Problem:   Mood disorder (HCC)  Subjective Data: The patient is a 27 year old male presented to the ER with concerns about his mental health, specifically mentioning uncontrollable emotions and frequent arguments with his wife. He described feeling increasingly unable to manage his emotions, leading to verbal altercations and physical outbursts, such as punching a wall. These episodes have escalated to the point where he has contemplated suicide, though he has not acted on these thoughts due to his responsibilities towards his three children.    Alcohol Use Disorder Identification Test Final Score (AUDIT): 0 The "Alcohol Use Disorders Identification Test", Guidelines for Use in Primary Care, Second Edition.  World Science writer [facility]). Score between 0-7:  no or low risk or alcohol related problems. Score between 8-15:  moderate risk of alcohol related problems. Score between 16-19:  high risk of alcohol related problems. Score 20 or above:  warrants further diagnostic evaluation for alcohol dependence and treatment.   CLINICAL FACTORS:   Depression:   Anhedonia Hopelessness Impulsivity Insomnia Alcohol/Substance Abuse/Dependencies   Musculoskeletal: Strength & Muscle Tone: within normal limits Gait & Station: normal Patient leans: N/A  Psychiatric Specialty Exam:  Presentation  General Appearance:  Appropriate for Environment  Eye Contact: Fair  Speech: Clear and Coherent  Speech Volume: Normal  Handedness: Right   Mood and Affect   Mood: Depressed; Hopeless  Affect: Congruent   Thought Process  Thought Processes: Coherent  Descriptions of Associations:Circumstantial  Orientation:Full (Time, Place and Person)  Thought Content:Logical  History of Schizophrenia/Schizoaffective disorder:No data recorded Duration of Psychotic Symptoms:No data recorded Hallucinations:Hallucinations: None  Ideas of Reference:None  Suicidal Thoughts:Suicidal Thoughts: Yes, Passive SI Passive Intent and/or Plan: Without Plan  Homicidal Thoughts:Homicidal Thoughts: No   Sensorium  Memory: Immediate Good; Recent Good; Remote Good  Judgment: Fair  Insight: Fair   Art therapist  Concentration: Good  Attention Span: Good  Recall: Good  Fund of Knowledge: Good  Language: Good   Psychomotor Activity  Psychomotor Activity: Psychomotor Activity: Normal   Assets  Assets: Communication Skills; Housing   Sleep  Sleep: Sleep: Good    Physical Exam: Physical Exam HENT:     Head: Normocephalic and atraumatic.     Nose: Nose normal.     Mouth/Throat:     Mouth: Mucous membranes are moist.  Eyes:     Pupils: Pupils are equal, round, and reactive to light.  Cardiovascular:     Rate and Rhythm: Regular rhythm.  Pulmonary:     Effort: Pulmonary effort is normal.  Skin:    General: Skin is warm.  Neurological:     Mental Status: He is alert and oriented to person, place, and time.    Review of Systems  Constitutional:  Negative for chills and fever.  HENT:  Negative for congestion, hearing loss and sore throat.   Eyes:  Negative for blurred vision and double vision.  Respiratory:  Negative for cough and shortness of breath.   Cardiovascular:  Negative for palpitations and leg swelling.  Gastrointestinal:  Positive for nausea and vomiting.  Psychiatric/Behavioral:  Positive for depression, substance abuse and suicidal ideas. The patient  is nervous/anxious and has insomnia.    Blood  pressure 118/87, pulse (!) 50, temperature 98.1 F (36.7 C), resp. rate 18, height 6\' 2"  (1.88 m), weight 104.3 kg, SpO2 98%. Body mass index is 29.53 kg/m.   COGNITIVE FEATURES THAT CONTRIBUTE TO RISK:  Thought constriction (tunnel vision)    SUICIDE RISK:   Mild:  Suicidal ideation of limited frequency, intensity, duration, and specificity.  There are no identifiable plans, no associated intent,  and identifiable protective factors, including available and accessible social support.  PLAN OF CARE: Per H&P  I certify that inpatient services furnished can reasonably be expected to improve the patient's condition.   Lewanda Rife, MD

## 2023-05-17 DIAGNOSIS — F39 Unspecified mood [affective] disorder: Secondary | ICD-10-CM | POA: Diagnosis not present

## 2023-05-17 MED ORDER — LITHIUM CARBONATE ER 300 MG PO TBCR
300.0000 mg | EXTENDED_RELEASE_TABLET | Freq: Two times a day (BID) | ORAL | Status: DC
  Administered 2023-05-17 – 2023-05-20 (×7): 300 mg via ORAL
  Filled 2023-05-17 (×7): qty 1

## 2023-05-17 NOTE — Plan of Care (Signed)

## 2023-05-17 NOTE — Progress Notes (Signed)
Patient given education on newly scheduled Lithium, per his request.

## 2023-05-17 NOTE — Progress Notes (Signed)
[facility] MD Progress Note  05/17/2023  Bernard Benton  MRN:  [id number]  The patient is a 27 year old male presented to the ER with concerns about his mental health, specifically mentioning uncontrollable emotions and frequent arguments with his wife. He described feeling increasingly unable to manage his emotions, leading to verbal altercations and physical outbursts, such as punching a wall. These episodes have escalated to the point where he has contemplated suicide, though he has not acted on these thoughts due to his responsibilities towards his three children.   Subjective: Case discussed in multidisciplinary meeting today, chart reviewed, patient seen during a.m. rounds.  Patient reports he is feeling better today.  Denies any nausea vomiting.  Patient reports he noted a rash from nicotine patch.  He reports that he was probably feeling sick because of nicotine patch.  Patient said that after taking of the patch off, he feel better.  He is requesting to start lithium.  Will start lithium at 300 mg by mouth twice daily.  Patient was encouraged to keep himself hydrated.  Patient reports he has been attending group.  He  shared that he talked to his wife yesterday evening.  He reports the phone call went well.  Patient was encouraged to work on coping strategies and anger management.  Today patient denies intention of harming himself on the unit.  He denies psychotic symptoms.   Principal Problem: Mood disorder (HCC) Diagnosis: Principal Problem:   Mood disorder (HCC) Active Problems:   PTSD (post-traumatic stress disorder)  Past Psychiatric History:  Patient reports he has been diagnosed with depression - Previously prescribed fluoxetine by PCP - No prior psychiatric hospitalizations  -Denies past history of suicide attempts  Past Medical History:  Past Medical History:  Diagnosis Date   Acid reflux    Depressed     Past Surgical History:  Procedure Laterality Date   HIP SURGERY       Family Psychiatric  History:  Patient reports his father has depression  Social History:  Social History   Substance and Sexual Activity  Alcohol Use Not Currently   Comment: occassionally     Social History   Substance and Sexual Activity  Drug Use Yes   Types: Marijuana   Comment: occassional    Social History   Socioeconomic History   Marital status: Married    Spouse name: Not on file   Number of children: Not on file   Years of education: Not on file   Highest education level: Not on file  Occupational History   Not on file  Tobacco Use   Smoking status: Former   Smokeless tobacco: Never  Vaping Use   Vaping status: Never Used  Substance and Sexual Activity   Alcohol use: Not Currently    Comment: occassionally   Drug use: Yes    Types: Marijuana    Comment: occassional   Sexual activity: Not on file  Other Topics Concern   Not on file  Social History Narrative   Not on file   Social Determinants of Health   Financial Resource Strain: Not on file  Food Insecurity: No Food Insecurity (05/15/2023)   Hunger Vital Sign    Worried About Running Out of Food in the Last Year: Never true    Ran Out of Food in the Last Year: Never true  Transportation Needs: No Transportation Needs (05/15/2023)   PRAPARE - Administrator, Civil Service (Medical): No    Lack of Transportation (Non-Medical):  No  Physical Activity: Not on file  Stress: Not on file  Social Connections: Not on file   Additional Social History:     Patient is married and lives with his wife of 5 years.  Patient has 3 children age 81, 81, and 33 year old.  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                      Sleep: Fair  Appetite:  Fair  Current Medications: Current Facility-Administered Medications  Medication Dose Route Frequency Provider Last Rate Last Admin   acetaminophen (TYLENOL) tablet 650 mg  650 mg Oral Q6H PRN Lauree Chandler, NP   650 mg  at 05/16/23 0802   alum & mag hydroxide-simeth (MAALOX/MYLANTA) 200-200-20 MG/5ML suspension 30 mL  30 mL Oral Q4H PRN Lauree Chandler, NP       diphenhydrAMINE (BENADRYL) capsule 50 mg  50 mg Oral TID PRN Lauree Chandler, NP       Or   diphenhydrAMINE (BENADRYL) injection 50 mg  50 mg Intramuscular TID PRN Lauree Chandler, NP       haloperidol (HALDOL) tablet 5 mg  5 mg Oral TID PRN Lauree Chandler, NP       Or   haloperidol lactate (HALDOL) injection 5 mg  5 mg Intramuscular TID PRN Lauree Chandler, NP       hydrOXYzine (ATARAX) tablet 25 mg  25 mg Oral TID PRN Lauree Chandler, NP       LORazepam (ATIVAN) tablet 2 mg  2 mg Oral TID PRN Lauree Chandler, NP       Or   LORazepam (ATIVAN) injection 2 mg  2 mg Intramuscular TID PRN Lauree Chandler, NP       magnesium hydroxide (MILK OF MAGNESIA) suspension 30 mL  30 mL Oral Daily PRN Lauree Chandler, NP       nicotine (NICODERM CQ - dosed in mg/24 hours) patch 21 mg  21 mg Transdermal Daily Lauree Chandler, NP   21 mg at 05/16/23 3244   nicotine polacrilex (NICORETTE) gum 2 mg  2 mg Oral PRN Lewanda Rife, MD   2 mg at 05/17/23 0832   ondansetron (ZOFRAN-ODT) disintegrating tablet 4 mg  4 mg Oral Q8H PRN Lewanda Rife, MD       traZODone (DESYREL) tablet 50 mg  50 mg Oral QHS PRN Lauree Chandler, NP   50 mg at 05/15/23 2232    Lab Results: No results found for this or any previous visit (from the past 48 hour(s)).  Blood Alcohol level:  Lab Results  Component Value Date   ETH <10 05/14/2023     Musculoskeletal: Strength & Muscle Tone: within normal limits Gait & Station: normal Patient leans: N/A   Psychiatric Specialty Exam:   Presentation  General Appearance:  Appropriate for Environment   Eye Contact: Fair   Speech: Clear and Coherent   Speech Volume: Normal   Handedness: Right     Mood and Affect  Mood: " Better"   Affect: Congruent     Thought Process   Thought Processes: Coherent   Descriptions of Associations:Circumstantial   Orientation:Full (Time, Place and Person)   Thought Content:Logical   History of Schizophrenia/Schizoaffective disorder:No   Duration of Psychotic Symptoms:NA   Hallucinations:Hallucinations: None   Ideas of Reference:None   Suicidal Thoughts:Suicidal Thoughts: Yes, Passive SI Passive Intent and/or Plan: Without Plan   Homicidal  Thoughts:Homicidal Thoughts: No     Sensorium  Memory: Immediate Good; Recent Good; Remote Good   Judgment: Fair   Insight: Fair     Art therapist  Concentration: Good   Attention Span: Good   Recall: Good   Fund of Knowledge: Good   Language: Good     Psychomotor Activity  Psychomotor Activity: Psychomotor Activity: Normal     Assets  Assets: Communication Skills; Housing     Sleep  Sleep: Sleep: Good       Physical Exam: Physical Exam HENT:     Head: Normocephalic and atraumatic.     Nose: Nose normal.     Mouth/Throat:     Mouth: Mucous membranes are moist.  Eyes:     Pupils: Pupils are equal, round, and reactive to light.  Cardiovascular:     Rate and Rhythm: Regular rhythm.  Pulmonary:     Effort: Pulmonary effort is normal.  Skin:    General: Skin is warm.  Neurological:     Mental Status: He is alert and oriented to person, place, and time.      Review of Systems  Constitutional:  Negative for chills and fever.  HENT:  Negative for congestion, hearing loss and sore throat.   Eyes:  Negative for blurred vision and double vision.  Respiratory:  Negative for cough and shortness of breath.   Cardiovascular:  Negative for palpitations and leg swelling.  Gastrointestinal: Denies nausea/ vomiting  Blood pressure 119/87, pulse (!) 58, temperature 98 F (36.7 C), temperature source Oral, resp. rate 18, height 6\' 2"  (1.88 m), weight 104.3 kg, SpO2 100%. Body mass index is 29.53 kg/m.   Treatment Plan  Summary: Daily contact with patient to assess and evaluate symptoms and progress in treatment and Medication management   Patient is admitted to locked unit under safety precautions Patient was started on Abilify in the ER for mood stabilization.  Patient reports he having side effect including nausea and vomiting from the medicine.  Will discontinue Abilify Start on lithium 300 mg by mouth twice daily Continue on Zofran as needed to help with N/V Patient was encouraged to attend group and work on coping strategies Social worker consulted to get collateral and help with a safe discharge plan  Lewanda Rife, MD

## 2023-05-17 NOTE — Progress Notes (Signed)
D: Pt alert and oriented. Pt denies experiencing any anxiety/depression at this time. Pt denies experiencing any pain at this time. Pt denies experiencing any SI/HI, or AVH at this time.    A: Scheduled medications administered to pt, per MD orders, prn trazodone 50 mg po offered at 2115 for c/o insomnia. Support and encouragement provided. Frequent verbal contact made. Routine safety checks conducted q15 minutes.   R: No adverse drug reactions noted. Pt verbally contracts for safety at this time. Pt complaint with medications. Pt interacts appropriately with others on the unit. Offered prn trazodone noted as effective upon reassessment. Pt remains safe at this time.

## 2023-05-17 NOTE — Group Note (Signed)
Date:  05/17/2023 Time:  3:35 PM  Group Topic/Focus:  Activity Group:  The focus of the group is to promote activity for the patients and allow them to go outside in the courtyard for some fresh air and some exercise.    Participation Level:  Active  Participation Quality:  Appropriate  Affect:  Appropriate  Cognitive:  Appropriate  Insight: Appropriate  Engagement in Group:  Engaged  Modes of Intervention:  Activity  Additional Comments:    Mary Sella Jeanie Mccard 05/17/2023, 3:35 PM
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
## 2023-05-17 NOTE — BH IP Treatment Plan (Signed)
Interdisciplinary Treatment and Diagnostic Plan Update  05/17/2023 Time of Session: 10:08AM BRIGHAM COBBINS MRN: [id number]  Principal Diagnosis: Mood disorder [facility])  Secondary Diagnoses: Principal Problem:   Mood disorder (HCC) Active Problems:   PTSD (post-traumatic stress disorder)   Current Medications:  Current Facility-Administered Medications  Medication Dose Route Frequency Provider Last Rate Last Admin   acetaminophen (TYLENOL) tablet 650 mg  650 mg Oral Q6H PRN Lauree Chandler, NP   650 mg at 05/16/23 0802   alum & mag hydroxide-simeth (MAALOX/MYLANTA) 200-200-20 MG/5ML suspension 30 mL  30 mL Oral Q4H PRN Lauree Chandler, NP       diphenhydrAMINE (BENADRYL) capsule 50 mg  50 mg Oral TID PRN Lauree Chandler, NP       Or   diphenhydrAMINE (BENADRYL) injection 50 mg  50 mg Intramuscular TID PRN Lauree Chandler, NP       haloperidol (HALDOL) tablet 5 mg  5 mg Oral TID PRN Lauree Chandler, NP       Or   haloperidol lactate (HALDOL) injection 5 mg  5 mg Intramuscular TID PRN Lauree Chandler, NP       hydrOXYzine (ATARAX) tablet 25 mg  25 mg Oral TID PRN Lauree Chandler, NP       lithium carbonate (LITHOBID) ER tablet 300 mg  300 mg Oral Q12H Lewanda Rife, MD   300 mg at 05/17/23 1223   LORazepam (ATIVAN) tablet 2 mg  2 mg Oral TID PRN Lauree Chandler, NP       Or   LORazepam (ATIVAN) injection 2 mg  2 mg Intramuscular TID PRN Lauree Chandler, NP       magnesium hydroxide (MILK OF MAGNESIA) suspension 30 mL  30 mL Oral Daily PRN Lauree Chandler, NP       nicotine (NICODERM CQ - dosed in mg/24 hours) patch 21 mg  21 mg Transdermal Daily Lauree Chandler, NP   21 mg at 05/16/23 6962   nicotine polacrilex (NICORETTE) gum 2 mg  2 mg Oral PRN Lewanda Rife, MD   2 mg at 05/17/23 1223   ondansetron (ZOFRAN-ODT) disintegrating tablet 4 mg  4 mg Oral Q8H PRN Lewanda Rife, MD       traZODone (DESYREL) tablet 50 mg  50 mg Oral QHS  PRN Lauree Chandler, NP   50 mg at 05/15/23 2232   PTA Medications: Medications Prior to Admission  Medication Sig Dispense Refill Last Dose   dicyclomine (BENTYL) 20 MG tablet Take 1 tablet (20 mg total) by mouth every 8 (eight) hours as needed for spasms (Abdominal cramping). (Patient not taking: Reported on 05/15/2023) 15 tablet 0    metoCLOPramide (REGLAN) 10 MG tablet Take 1 tablet (10 mg total) by mouth 4 (four) times daily -  before meals and at bedtime. (Patient not taking: Reported on 05/15/2023) 60 tablet 0    ondansetron (ZOFRAN ODT) 4 MG disintegrating tablet Take 1 tablet (4 mg total) by mouth every 6 (six) hours as needed for nausea or vomiting. (Patient not taking: Reported on 05/15/2023) 20 tablet 0    promethazine (PHENERGAN) 25 MG tablet Take 1 tablet (25 mg total) by mouth every 6 (six) hours as needed for nausea or vomiting. (Patient not taking: Reported on 05/15/2023) 20 tablet 0    ranitidine (ZANTAC) 150 MG capsule Take 1 capsule (150 mg total) by mouth 2 (two) times daily. (Patient not taking: Reported on 05/15/2023) 28 capsule 0  sucralfate (CARAFATE) 1 g tablet Take 1 tablet (1 g total) by mouth 4 (four) times daily. (Patient not taking: Reported on 05/15/2023) 120 tablet 1     Patient Stressors:    Patient Strengths:    Treatment Modalities: Medication Management, Group therapy, Case management,  1 to 1 session with clinician, Psychoeducation, Recreational therapy.   Physician Treatment Plan for Primary Diagnosis: Mood disorder (HCC) Long Term Goal(s): Improvement in symptoms so as ready for discharge   Short Term Goals: Ability to identify changes in lifestyle to reduce recurrence of condition will improve Ability to verbalize feelings will improve Ability to disclose and discuss suicidal ideas Ability to demonstrate self-control will improve Ability to identify and develop effective coping behaviors will improve Ability to maintain clinical measurements  within normal limits will improve Compliance with prescribed medications will improve Ability to identify triggers associated with substance abuse/mental health issues will improve  Medication Management: Evaluate patient's response, side effects, and tolerance of medication regimen.  Therapeutic Interventions: 1 to 1 sessions, Unit Group sessions and Medication administration.  Evaluation of Outcomes: Not Met  Physician Treatment Plan for Secondary Diagnosis: Principal Problem:   Mood disorder (HCC) Active Problems:   PTSD (post-traumatic stress disorder)  Long Term Goal(s): Improvement in symptoms so as ready for discharge   Short Term Goals: Ability to identify changes in lifestyle to reduce recurrence of condition will improve Ability to verbalize feelings will improve Ability to disclose and discuss suicidal ideas Ability to demonstrate self-control will improve Ability to identify and develop effective coping behaviors will improve Ability to maintain clinical measurements within normal limits will improve Compliance with prescribed medications will improve Ability to identify triggers associated with substance abuse/mental health issues will improve     Medication Management: Evaluate patient's response, side effects, and tolerance of medication regimen.  Therapeutic Interventions: 1 to 1 sessions, Unit Group sessions and Medication administration.  Evaluation of Outcomes: Not Met   RN Treatment Plan for Primary Diagnosis: Mood disorder (HCC) Long Term Goal(s): Knowledge of disease and therapeutic regimen to maintain health will improve  Short Term Goals: Ability to verbalize frustration and anger appropriately will improve, Ability to demonstrate self-control, Ability to verbalize feelings will improve, Ability to identify and develop effective coping behaviors will improve, and Compliance with prescribed medications will improve  Medication Management: RN will  administer medications as ordered by provider, will assess and evaluate patient's response and provide education to patient for prescribed medication. RN will report any adverse and/or side effects to prescribing provider.  Therapeutic Interventions: 1 on 1 counseling sessions, Psychoeducation, Medication administration, Evaluate responses to treatment, Monitor vital signs and CBGs as ordered, Perform/monitor CIWA, COWS, AIMS and Fall Risk screenings as ordered, Perform wound care treatments as ordered.  Evaluation of Outcomes: Not Met   LCSW Treatment Plan for Primary Diagnosis: Mood disorder (HCC) Long Term Goal(s): Safe transition to appropriate next level of care at discharge, Engage patient in therapeutic group addressing interpersonal concerns.  Short Term Goals: Engage patient in aftercare planning with referrals and resources, Increase social support, Increase ability to appropriately verbalize feelings, Increase emotional regulation, Facilitate patient progression through stages of change regarding substance use diagnoses and concerns, and Identify triggers associated with mental health/substance abuse issues  Therapeutic Interventions: Assess for all discharge needs, 1 to 1 time with Social worker, Explore available resources and support systems, Assess for adequacy in community support network, Educate family and significant other(s) on suicide prevention, Complete Psychosocial Assessment, Interpersonal group therapy.  Evaluation of Outcomes: Not Met   Progress in Treatment: Attending groups: Yes. Participating in groups: Yes. Taking medication as prescribed: Yes. Toleration medication: Yes. Family/Significant other contact made: No, will contact:  CSW will contact once permission is granted.  Patient understands diagnosis: Yes. Discussing patient identified problems/goals with staff: Yes. Medical problems stabilized or resolved: Yes. Denies suicidal/homicidal ideation:  Yes. Issues/concerns per patient self-inventory: Yes. Other: None  New problem(s) identified: No, Describe:  None  New Short Term/Long Term Goal(s):detox, elimination of symptoms of psychosis, medication management for mood stabilization; elimination of SI thoughts; development of comprehensive mental wellness/sobriety plan.    Patient Goals:  "Want to understand what's going on in my head."  Discharge Plan or Barriers: CSW to assist in the development of appropriate discharge plan for pt.   Reason for Continuation of Hospitalization: Anxiety, depression, mood dysregulation   Estimated Length of Stay:1-7 days.   Last 3 [place] Suicide Severity Risk Score: Flowsheet Row Admission (Current) from 05/15/2023 in [facility] INPATIENT BEHAV[facility]AL MEDICINE ED from 05/14/2023 in [facility] Emergency Department at [facility] ED from 10/04/2020 in [facility] Emergency Department at [facility]  C-SSRS RISK CATEGORY High Risk High Risk Error: Question 6 not populated       Last [facility] 2/9 Scores:     No data to display          Scribe for Treatment Team: Lowry Ram, LCSW 05/17/2023 1:08 PM

## 2023-05-17 NOTE — Progress Notes (Signed)
   05/17/23 1700  Psych Admission Type (Psych Patients Only)  Admission Status Voluntary  Psychosocial Assessment  Patient Complaints Depression;Hopelessness  Eye Contact Fair  Facial Expression Worried  Affect Preoccupied;Appropriate to circumstance  Speech Logical/coherent  Interaction Assertive  Motor Activity Slow  Appearance/Hygiene In scrubs;Unremarkable  Behavior Characteristics Cooperative;Appropriate to situation  Mood Pleasant  Aggressive Behavior  Effect No apparent injury  Thought Process  Coherency Circumstantial  Content WDL  Delusions None reported or observed  Perception WDL  Hallucination None reported or observed  Judgment WDL  Confusion None  Danger to Self  Current suicidal ideation? Denies  Danger to Others  Danger to Others None reported or observed

## 2023-05-17 NOTE — Group Note (Signed)
Recreation Therapy Group Note   Group Topic:Leisure Education  Group Date: 05/17/2023 Start Time: 1100 End Time: 1200 Facilitators: Rosina Lowenstein, LRT, CTRS Location:  Craft Room  Group Description: Leisure. Patients were given the option to choose from singing karaoke, coloring mandalas, using oil pastels, journaling, or playing with play-doh. LRT and pts discussed the meaning of leisure, the importance of participating in leisure during their free time/when they're outside of the hospital, as well as how our leisure interests can also serve as coping skills.    Goal Area(s) Addressed:  Patient will identify a current leisure interest.  Patient will learn the definition of "leisure". Patient will practice making a positive decision. Patient will have the opportunity to try a new leisure activity. Patient will communicate with peers and LRT.    Affect/Mood: Appropriate   Participation Level: Active and Engaged   Participation Quality: Independent   Behavior: Appropriate, Calm, and Cooperative   Speech/Thought Process: Coherent   Insight: Good   Judgement: Good   Modes of Intervention: Activity   Patient Response to Interventions:  Attentive, Engaged, Interested , and Receptive   Education Outcome:  Acknowledges education   Clinical Observations/Individualized Feedback: Bernard Benton was active in their participation of session activities and group discussion. Pt identified "play video games and exercise" as things he does in his free time. Pt chose to sing karaoke and draw with oil pastels while in group. Pt interacted well with LRT and peers duration of session.    Plan: Continue to engage patient in RT group sessions 2-3x/week.   Rosina Lowenstein, LRT, CTRS 05/17/2023 1:12 PM

## 2023-05-17 NOTE — Plan of Care (Signed)
 Pt denies SI/HI/AVH, compliant with procedures on the unit   Problem: Education: Goal: Knowledge of [facility] Education information/materials will improve Outcome: Progressing Goal: Emotional status will improve Outcome: Progressing Goal: Mental status will improve Outcome: Progressing Goal: Verbalization of understanding the information provided will improve Outcome: Progressing   Problem: Activity: Goal: Interest or engagement in activities will improve Outcome: Progressing Goal: Sleeping patterns will improve Outcome: Progressing   Problem: Coping: Goal: Ability to verbalize frustrations and anger appropriately will improve Outcome: Progressing Goal: Ability to demonstrate self-control will improve Outcome: Progressing   Problem: Health Behavior/Discharge Planning: Goal: Identification of resources available to assist in meeting health care needs will improve Outcome: Progressing Goal: Compliance with treatment plan for underlying cause of condition will improve Outcome: Progressing   Problem: Physical Regulation: Goal: Ability to maintain clinical measurements within normal limits will improve Outcome: Progressing   Problem: Safety: Goal: Periods of time without injury will increase Outcome: Progressing

## 2023-05-18 DIAGNOSIS — F39 Unspecified mood [affective] disorder: Secondary | ICD-10-CM | POA: Diagnosis not present

## 2023-05-18 MED ORDER — NICOTINE POLACRILEX 2 MG MT GUM
4.0000 mg | CHEWING_GUM | OROMUCOSAL | Status: DC | PRN
  Administered 2023-05-18 – 2023-05-20 (×6): 4 mg via ORAL
  Filled 2023-05-18 (×5): qty 2

## 2023-05-18 NOTE — Progress Notes (Signed)
[facility] MD Progress Note  05/18/2023  Bernard Benton  MRN:  [id number]  The patient is a 27 year old male presented to the ER with concerns about his mental health, specifically mentioning uncontrollable emotions and frequent arguments with his wife. He described feeling increasingly unable to manage his emotions, leading to verbal altercations and physical outbursts, such as punching a wall. These episodes have escalated to the point where he has contemplated suicide, though he has not acted on these thoughts due to his responsibilities towards his three children.   Subjective: Case discussed in multidisciplinary meeting today, chart reviewed, patient seen during a.m. rounds.  Patient reports he is feeling better today.  Patient denies side effects from lithium.  Denies any nausea vomiting.  Patient reports that he has been attending groups which are helpful.  Patient reports he slept fine last night.  His appetite has improved.  Patient feels close to his baseline.  Patient had some questions about FMLA.  Patient was informed that since he is being discharged Monday he will get a note from provider stating his length of stay in the hospital.  Patient was encouraged to work on coping strategies and anger management.  Today patient denies intention of harming himself on the unit.  He denies psychotic symptoms.  He is of the observed interacting well with peers.   Principal Problem: Mood disorder (HCC) Diagnosis: Principal Problem:   Mood disorder (HCC) Active Problems:   PTSD (post-traumatic stress disorder)  Past Psychiatric History:  Patient reports he has been diagnosed with depression - Previously prescribed fluoxetine by PCP - No prior psychiatric hospitalizations  -Denies past history of suicide attempts  Past Medical History:  Past Medical History:  Diagnosis Date   Acid reflux    Depressed     Past Surgical History:  Procedure Laterality Date   HIP SURGERY      Family Psychiatric   History:  Patient reports his father has depression  Social History:  Social History   Substance and Sexual Activity  Alcohol Use Not Currently   Comment: occassionally     Social History   Substance and Sexual Activity  Drug Use Yes   Types: Marijuana   Comment: occassional    Social History   Socioeconomic History   Marital status: Married    Spouse name: Not on file   Number of children: Not on file   Years of education: Not on file   Highest education level: Not on file  Occupational History   Not on file  Tobacco Use   Smoking status: Former   Smokeless tobacco: Never  Vaping Use   Vaping status: Never Used  Substance and Sexual Activity   Alcohol use: Not Currently    Comment: occassionally   Drug use: Yes    Types: Marijuana    Comment: occassional   Sexual activity: Not on file  Other Topics Concern   Not on file  Social History Narrative   Not on file   Social Determinants of Health   Financial Resource Strain: Not on file  Food Insecurity: No Food Insecurity (05/15/2023)   Hunger Vital Sign    Worried About Running Out of Food in the Last Year: Never true    Ran Out of Food in the Last Year: Never true  Transportation Needs: No Transportation Needs (05/15/2023)   PRAPARE - Administrator, Civil Service (Medical): No    Lack of Transportation (Non-Medical): No  Physical Activity: Not on file  Stress: Not on file  Social Connections: Not on file   Additional Social History:     Patient is married and lives with his wife of 5 years.  Patient has 3 children age 69, 25, and 56 year old.  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                      Sleep: Fair  Appetite:  Fair  Current Medications: Current Facility-Administered Medications  Medication Dose Route Frequency Provider Last Rate Last Admin   acetaminophen (TYLENOL) tablet 650 mg  650 mg Oral Q6H PRN Lauree Chandler, NP   650 mg at 05/16/23 0802   alum  & mag hydroxide-simeth (MAALOX/MYLANTA) 200-200-20 MG/5ML suspension 30 mL  30 mL Oral Q4H PRN Lauree Chandler, NP       diphenhydrAMINE (BENADRYL) capsule 50 mg  50 mg Oral TID PRN Lauree Chandler, NP       Or   diphenhydrAMINE (BENADRYL) injection 50 mg  50 mg Intramuscular TID PRN Lauree Chandler, NP       haloperidol (HALDOL) tablet 5 mg  5 mg Oral TID PRN Lauree Chandler, NP       Or   haloperidol lactate (HALDOL) injection 5 mg  5 mg Intramuscular TID PRN Lauree Chandler, NP       hydrOXYzine (ATARAX) tablet 25 mg  25 mg Oral TID PRN Lauree Chandler, NP       lithium carbonate (LITHOBID) ER tablet 300 mg  300 mg Oral Q12H Lewanda Rife, MD   300 mg at 05/18/23 0835   LORazepam (ATIVAN) tablet 2 mg  2 mg Oral TID PRN Lauree Chandler, NP       Or   LORazepam (ATIVAN) injection 2 mg  2 mg Intramuscular TID PRN Lauree Chandler, NP       magnesium hydroxide (MILK OF MAGNESIA) suspension 30 mL  30 mL Oral Daily PRN Lauree Chandler, NP       nicotine polacrilex (NICORETTE) gum 2 mg  2 mg Oral PRN Lewanda Rife, MD   2 mg at 05/18/23 0836   ondansetron (ZOFRAN-ODT) disintegrating tablet 4 mg  4 mg Oral Q8H PRN Lewanda Rife, MD       traZODone (DESYREL) tablet 50 mg  50 mg Oral QHS PRN Lauree Chandler, NP   50 mg at 05/17/23 2114    Lab Results: No results found for this or any previous visit (from the past 48 hour(s)).  Blood Alcohol level:  Lab Results  Component Value Date   ETH <10 05/14/2023     Musculoskeletal: Strength & Muscle Tone: within normal limits Gait & Station: normal Patient leans: N/A   Psychiatric Specialty Exam:   Presentation  General Appearance:  Appropriate for Environment   Eye Contact: Fair   Speech: Clear and Coherent   Speech Volume: Normal   Handedness: Right     Mood and Affect  Mood: " Better"   Affect: Congruent     Thought Process  Thought Processes: Coherent   Descriptions  of Associations:Circumstantial   Orientation:Full (Time, Place and Person)   Thought Content:Logical   History of Schizophrenia/Schizoaffective disorder:No   Duration of Psychotic Symptoms:NA   Hallucinations:Hallucinations: None   Ideas of Reference:None   Suicidal Thoughts: Patient denies suicide thoughts   Homicidal Thoughts: No     Sensorium  Memory: Immediate Good; Recent Good; Remote Good   Judgment: Fair  Insight: Fair     Chartered certified accountant: Good   Attention Span: Good   Recall: Good   Fund of Knowledge: Good   Language: Good     Psychomotor Activity  Psychomotor Activity: Psychomotor Activity: Normal     Assets  Assets: Communication Skills; Housing     Sleep  Sleep: Sleep: Good       Physical Exam: Physical Exam HENT:     Head: Normocephalic and atraumatic.     Nose: Nose normal.     Mouth/Throat:     Mouth: Mucous membranes are moist.  Eyes:     Pupils: Pupils are equal, round, and reactive to light.  Cardiovascular:     Rate and Rhythm: Regular rhythm.  Pulmonary:     Effort: Pulmonary effort is normal.  Skin:    General: Skin is warm.  Neurological:     Mental Status: He is alert and oriented to person, place, and time.      Review of Systems  Constitutional:  Negative for chills and fever.  HENT:  Negative for congestion, hearing loss and sore throat.   Eyes:  Negative for blurred vision and double vision.  Respiratory:  Negative for cough and shortness of breath.   Cardiovascular:  Negative for palpitations and leg swelling.  Gastrointestinal: Denies nausea/ vomiting  Blood pressure 123/61, pulse 88, temperature 97.8 F (36.6 C), temperature source Oral, resp. rate 18, height 6\' 2"  (1.88 m), weight 104.3 kg, SpO2 100%. Body mass index is 29.53 kg/m.   Treatment Plan Summary: Daily contact with patient to assess and evaluate symptoms and progress in treatment and Medication  management   Patient is admitted to locked unit under safety precautions Patient was started on Abilify in the ER for mood stabilization.  Patient reports he having side effect including nausea and vomiting from the medicine.  Discontinued Abilify Continue on lithium 300 mg by mouth twice daily Continue on Zofran as needed to help with N/V Patient was encouraged to attend group and work on coping strategies Social worker consulted to get collateral and help with a safe discharge plan Estimated length of stay 2 to 3 days  Lewanda Rife, MD

## 2023-05-18 NOTE — Group Note (Signed)
Date:  05/18/2023 Time:  12:45 PM  Group Topic/Focus:  Goals Group:   The focus of this group is to help patients establish daily goals to achieve during treatment and discuss how the patient can incorporate goal setting into their daily lives to aide in recovery.    Participation Level:  Active  Participation Quality:  Appropriate  Affect:  Appropriate  Cognitive:  Appropriate  Insight: Appropriate  Engagement in Group:  Engaged  Modes of Intervention:  Activity  Additional Comments:    Bernard Benton 05/18/2023, 12:45 PM

## 2023-05-18 NOTE — Plan of Care (Signed)

## 2023-05-18 NOTE — Progress Notes (Signed)
D- Patient alert and oriented. Patient presented. Denies SI, HI, AVH, and pain. Quotes. Goal. How did they do with achieving previous goal.  A- Scheduled medications administered to patient, per MD orders. Support and encouragement provided.  Routine safety checks conducted every 15 minutes.  Patient informed to notify staff with problems or concerns.  R- No adverse drug reactions noted. Patient contracts for safety at this time. Patient compliant with medications and treatment plan. Patient receptive, calm, and cooperative. Patient interacts well with others on the unit.  Patient remains safe at this time.   05/18/23 0840  Psych Admission Type (Psych Patients Only)  Admission Status Voluntary  Psychosocial Assessment  Patient Complaints None  Eye Contact Fair  Facial Expression Other (Comment) (appropriate)  Affect Appropriate to circumstance  Speech Logical/coherent  Interaction Assertive  Motor Activity Slow  Appearance/Hygiene In scrubs;Unremarkable  Behavior Characteristics Cooperative;Appropriate to situation  Mood Pleasant  Aggressive Behavior  Effect No apparent injury  Thought Process  Coherency WDL  Content WDL  Delusions None reported or observed  Perception WDL  Hallucination None reported or observed  Judgment WDL  Confusion None  Danger to Self  Current suicidal ideation? Denies  Danger to Others  Danger to Others None reported or observed

## 2023-05-18 NOTE — Group Note (Signed)
Date:  05/18/2023 Time:  9:29 PM  Group Topic/Focus:  Wrap-Up Group:   The focus of this group is to help patients review their daily goal of treatment and discuss progress on daily workbooks.    Participation Level:  Did Not Attend   Katina Dung 05/18/2023, 9:29 PM

## 2023-05-19 DIAGNOSIS — F39 Unspecified mood [affective] disorder: Secondary | ICD-10-CM | POA: Diagnosis not present

## 2023-05-19 NOTE — Group Note (Signed)
[facility] LCSW Group Therapy Note   Group Date: 05/19/2023 Start Time: 1550 End Time: 1635   Type of Therapy/Topic:  Group Therapy:  Emotion Regulation  Participation Level:  Active   Mood:  Description of Group:    The purpose of this group is to assist patients in learning to regulate negative emotions and experience positive emotions. Patients will be guided to discuss ways in which they have been vulnerable to their negative emotions. These vulnerabilities will be juxtaposed with experiences of positive emotions or situations, and patients challenged to use positive emotions to combat negative ones. Special emphasis will be placed on coping with negative emotions in conflict situations, and patients will process healthy conflict resolution skills.  Therapeutic Goals: Patient will identify two positive emotions or experiences to reflect on in order to balance out negative emotions:  Patient will label two or more emotions that they find the most difficult to experience:  Patient will be able to demonstrate positive conflict resolution skills through discussion or role plays:   Summary of Patient Progress:  The patient attended group. The patient participated during the game of Emotional Regulation BINGO. The patient participated during the group discussion.     Therapeutic Modalities:   Cognitive Behavioral Therapy Feelings Identification Dialectical Behavioral Therapy   Marshell Levan, LCSW

## 2023-05-19 NOTE — Group Note (Signed)
Date:  05/19/2023 Time:  10:17 AM  Group Topic/Focus:  Conflict Resolution:   The focus of this group is to discuss the conflict resolution process and how it may be used upon discharge. Goals Group:   The focus of this group is to help patients establish daily goals to achieve during treatment and discuss how the patient can incorporate goal setting into their daily lives to aide in recovery. Healthy Communication:   The focus of this group is to discuss communication, barriers to communication, as well as healthy ways to communicate with others.    Participation Level:  Active  Participation Quality:  Appropriate, Attentive, Sharing, and Supportive  Affect:  Appropriate  Cognitive:  Alert, Appropriate, and Oriented  Insight: Appropriate  Engagement in Group:  Developing/Improving  Modes of Intervention:  Activity, Discussion, and Education  Additional Comments:    Rosaura Carpenter 05/19/2023, 10:17 AM

## 2023-05-19 NOTE — Progress Notes (Signed)
   05/19/23 2300  Psych Admission Type (Psych Patients Only)  Admission Status Voluntary  Psychosocial Assessment  Patient Complaints None  Eye Contact Fair  Facial Expression Animated  Affect Appropriate to circumstance  Speech Logical/coherent  Interaction Assertive  Motor Activity Slow  Appearance/Hygiene Unremarkable;In scrubs  Behavior Characteristics Cooperative  Mood Pleasant  Thought Process  Coherency WDL  Content WDL  Delusions None reported or observed  Perception WDL  Hallucination None reported or observed  Judgment WDL  Danger to Self  Current suicidal ideation? Denies  Danger to Others  Danger to Others None reported or observed

## 2023-05-19 NOTE — Plan of Care (Signed)
°  Problem: Education: °Goal: Knowledge of [facility] Education information/materials will improve °Outcome: Not Progressing °Goal: Emotional status will improve °Outcome: Not Progressing °Goal: Mental status will improve °Outcome: Not Progressing °Goal: Verbalization of understanding the information provided will improve °Outcome: Not Progressing °  °

## 2023-05-19 NOTE — Group Note (Signed)
Date:  05/19/2023 Time:  9:17 PM  Group Topic/Focus:  Wrap-Up Group:   The focus of this group is to help patients review their daily goal of treatment and discuss progress on daily workbooks.    Participation Level:  Active  Participation Quality:  Appropriate and Attentive  Affect:  Appropriate  Cognitive:  Alert and Appropriate  Insight: Appropriate  Engagement in Group:  Engaged and Supportive  Modes of Intervention:  Discussion  Additional Comments:     Maglione,Quantez Schnyder E 05/19/2023, 9:17 PM

## 2023-05-19 NOTE — [facility] Counselor (Signed)
CSW contacted Bernard Benton, wife [phone], to complete SPE.  Bernard Benton reports that she is not sure how well pt is doing because they have not talked much since he has been here. She reports that she thinks he needs to go stay with his parents for a little while.   Bernard Benton reports that pt has said some violent things in the past and has violent tendencies.   Bernard Benton reports that she doesn't know what is available to pt at his parents house but reports that she only has knives at their shared home.   Bernard Benton reports she gave pt's gun to officer who took him in.   Bernard Benton reports that she wants FMLA papework filled out for pt.   CSW informed [name] that that's something that is usually filled out by a patients primary care.   Pt wife verbalized understanding.   CSW encouraged pt's wife to call back if she had any questions.   Reynaldo Minium, MSW, [place] 05/19/2023 3:58 PM

## 2023-05-19 NOTE — Progress Notes (Signed)
[facility] MD Progress Note  05/19/2023  Bernard Benton  MRN:  [id number]  The patient is a 27 year old male presented to the ER with concerns about his mental health, specifically mentioning uncontrollable emotions and frequent arguments with his wife. He described feeling increasingly unable to manage his emotions, leading to verbal altercations and physical outbursts, such as punching a wall. These episodes have escalated to the point where he has contemplated suicide, though he has not acted on these thoughts due to his responsibilities towards his three children.   Subjective: Case discussed in multidisciplinary meeting today, chart reviewed, patient seen during a.m. rounds.  No new acute events overnight. Patient reports he is feels good today.  Patient denies side effects from lithium.   Patient reports he slept fine last night.  His appetite has improved.  Patient feels ready to go home tomorrow.  Patient was encouraged to continue to work on coping strategies and anger management.  Today patient denies intention of harming himself on the unit.  He denies psychotic symptoms.  He is of the observed interacting well with peers.   Principal Problem: Mood disorder (HCC) Diagnosis: Principal Problem:   Mood disorder (HCC) Active Problems:   PTSD (post-traumatic stress disorder)  Past Psychiatric History:  Patient reports he has been diagnosed with depression - Previously prescribed fluoxetine by PCP - No prior psychiatric hospitalizations  -Denies past history of suicide attempts  Past Medical History:  Past Medical History:  Diagnosis Date   Acid reflux    Depressed     Past Surgical History:  Procedure Laterality Date   HIP SURGERY      Family Psychiatric  History:  Patient reports his father has depression  Social History:  Social History   Substance and Sexual Activity  Alcohol Use Not Currently   Comment: occassionally     Social History   Substance and Sexual Activity   Drug Use Yes   Types: Marijuana   Comment: occassional    Social History   Socioeconomic History   Marital status: Married    Spouse name: Not on file   Number of children: Not on file   Years of education: Not on file   Highest education level: Not on file  Occupational History   Not on file  Tobacco Use   Smoking status: Former   Smokeless tobacco: Never  Vaping Use   Vaping status: Never Used  Substance and Sexual Activity   Alcohol use: Not Currently    Comment: occassionally   Drug use: Yes    Types: Marijuana    Comment: occassional   Sexual activity: Not on file  Other Topics Concern   Not on file  Social History Narrative   Not on file   Social Determinants of Health   Financial Resource Strain: Not on file  Food Insecurity: No Food Insecurity (05/15/2023)   Hunger Vital Sign    Worried About Running Out of Food in the Last Year: Never true    Ran Out of Food in the Last Year: Never true  Transportation Needs: No Transportation Needs (05/15/2023)   PRAPARE - Administrator, Civil Service (Medical): No    Lack of Transportation (Non-Medical): No  Physical Activity: Not on file  Stress: Not on file  Social Connections: Not on file   Additional Social History:     Patient is married and lives with his wife of 5 years.  Patient has 3 children age 97, 2, and 1 year  old.  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                      Sleep: Fair  Appetite:  Fair  Current Medications: Current Facility-Administered Medications  Medication Dose Route Frequency Provider Last Rate Last Admin   acetaminophen (TYLENOL) tablet 650 mg  650 mg Oral Q6H PRN Lauree Chandler, NP   650 mg at 05/16/23 0802   alum & mag hydroxide-simeth (MAALOX/MYLANTA) 200-200-20 MG/5ML suspension 30 mL  30 mL Oral Q4H PRN Lauree Chandler, NP       diphenhydrAMINE (BENADRYL) capsule 50 mg  50 mg Oral TID PRN Lauree Chandler, NP       Or    diphenhydrAMINE (BENADRYL) injection 50 mg  50 mg Intramuscular TID PRN Lauree Chandler, NP       haloperidol (HALDOL) tablet 5 mg  5 mg Oral TID PRN Lauree Chandler, NP       Or   haloperidol lactate (HALDOL) injection 5 mg  5 mg Intramuscular TID PRN Lauree Chandler, NP       hydrOXYzine (ATARAX) tablet 25 mg  25 mg Oral TID PRN Lauree Chandler, NP       lithium carbonate (LITHOBID) ER tablet 300 mg  300 mg Oral Q12H Lewanda Rife, MD   300 mg at 05/19/23 0809   LORazepam (ATIVAN) tablet 2 mg  2 mg Oral TID PRN Lauree Chandler, NP       Or   LORazepam (ATIVAN) injection 2 mg  2 mg Intramuscular TID PRN Lauree Chandler, NP       magnesium hydroxide (MILK OF MAGNESIA) suspension 30 mL  30 mL Oral Daily PRN Lauree Chandler, NP       nicotine polacrilex (NICORETTE) gum 4 mg  4 mg Oral PRN Lewanda Rife, MD   4 mg at 05/19/23 1209   ondansetron (ZOFRAN-ODT) disintegrating tablet 4 mg  4 mg Oral Q8H PRN Lewanda Rife, MD       traZODone (DESYREL) tablet 50 mg  50 mg Oral QHS PRN Lauree Chandler, NP   50 mg at 05/18/23 2110    Lab Results: No results found for this or any previous visit (from the past 48 hour(s)).  Blood Alcohol level:  Lab Results  Component Value Date   ETH <10 05/14/2023     Musculoskeletal: Strength & Muscle Tone: within normal limits Gait & Station: normal Patient leans: N/A   Psychiatric Specialty Exam:   Presentation  General Appearance:  Appropriate for Environment   Eye Contact: Fair   Speech: Clear and Coherent   Speech Volume: Normal   Handedness: Right     Mood and Affect  Mood: " good"   Affect: Congruent     Thought Process  Thought Processes: Coherent   Descriptions of Associations:Circumstantial   Orientation:Full (Time, Place and Person)   Thought Content:Logical   History of Schizophrenia/Schizoaffective disorder:No   Duration of Psychotic Symptoms:NA    Hallucinations:Hallucinations: None   Ideas of Reference:None   Suicidal Thoughts: Patient denies suicide thoughts   Homicidal Thoughts: No     Sensorium  Memory: Immediate Good; Recent Good; Remote Good   Judgment: Fair   Insight: Fair     Art therapist  Concentration: Good   Attention Span: Good   Recall: Good   Fund of Knowledge: Good   Language: Good     Psychomotor Activity  Psychomotor Activity:  Psychomotor Activity: Normal     Assets  Assets: Communication Skills; Housing     Sleep  Sleep: Sleep: Good       Physical Exam: Physical Exam HENT:     Head: Normocephalic and atraumatic.     Nose: Nose normal.     Mouth/Throat:     Mouth: Mucous membranes are moist.  Eyes:     Pupils: Pupils are equal, round, and reactive to light.  Cardiovascular:     Rate and Rhythm: Regular rhythm.  Pulmonary:     Effort: Pulmonary effort is normal.  Skin:    General: Skin is warm.  Neurological:     Mental Status: He is alert and oriented to person, place, and time.      Review of Systems  Constitutional:  Negative for chills and fever.  HENT:  Negative for congestion, hearing loss and sore throat.   Eyes:  Negative for blurred vision and double vision.  Respiratory:  Negative for cough and shortness of breath.   Cardiovascular:  Negative for palpitations and leg swelling.  Gastrointestinal: Denies nausea/ vomiting  Blood pressure 132/71, pulse 71, temperature 98.1 F (36.7 C), resp. rate 20, height 6\' 2"  (1.88 m), weight 104.3 kg, SpO2 100%. Body mass index is 29.53 kg/m.   Treatment Plan Summary: Daily contact with patient to assess and evaluate symptoms and progress in treatment and Medication management   Patient is admitted to locked unit under safety precautions Patient was started on Abilify in the ER for mood stabilization.  Patient reports he having side effect including nausea and vomiting from the medicine.  Discontinued  Abilify Continue on lithium 300 mg by mouth twice daily Continue on Zofran as needed to help with N/V Patient was encouraged to attend group and work on coping strategies Social worker consulted to get collateral and help with a safe discharge plan Lithium level tomorrow Am, anticipated discharge tomorrow  Lewanda Rife, MD

## 2023-05-19 NOTE — [facility] Suicide Risk Assessment (Signed)
[facility] INPATIENT:  Family/Significant Other Suicide Prevention Education  Suicide Prevention Education:  Education Completed; Allena Earing, wife, [phone] has been identified by the patient as the family member/significant other with whom the patient will be residing, and identified as the person(s) who will aid the patient in the event of a mental health crisis (suicidal ideations/suicide attempt).  With written consent from the patient, the family member/significant other has been provided the following suicide prevention education, prior to the and/or following the discharge of the patient.  The suicide prevention education provided includes the following: Suicide risk factors Suicide prevention and interventions National Suicide Hotline telephone number [facility] assessment telephone number [facility] Emergency Assistance 911 [facility] and/or Residential Mobile Crisis Unit telephone number  Request made of family/significant other to: Remove weapons (e.g., guns, rifles, knives), all items previously/currently identified as safety concern.   Remove drugs/medications (over-the-counter, prescriptions, illicit drugs), all items previously/currently identified as a safety concern.  The family member/significant other verbalizes understanding of the suicide prevention education information provided.  The family member/significant other agrees to remove the items of safety concern listed above.  Elza Rafter 05/19/2023, 3:36 PM

## 2023-05-19 NOTE — Group Note (Signed)
Date:  05/19/2023 Time:  4:20 PM  Group Topic/Focus:  OUTDOOR RECREATION STRUCTURED ACTIVITY    Participation Level:  Active  Participation Quality:  Appropriate  Affect:  Appropriate  Cognitive:  Appropriate  Insight: Appropriate  Engagement in Group:  Developing/Improving  Modes of Intervention:  Activity  Additional Comments:    Heavan Francom 05/19/2023, 4:20 PM

## 2023-05-19 NOTE — Progress Notes (Signed)
Nursing Shift Note:  1900-0700  Attended Evening Group: No Medication Compliant:  Yes Behavior: cooperative; assertive; loud at times Sleep Quality: good Significant Changes: none   05/19/23 0100  Psych Admission Type (Psych Patients Only)  Admission Status Voluntary  Psychosocial Assessment  Patient Complaints None  Eye Contact Fair  Affect Appropriate to circumstance  Speech Logical/coherent  Interaction Assertive  Motor Activity Slow  Appearance/Hygiene In scrubs;Unremarkable  Behavior Characteristics Cooperative  Mood Pleasant  Thought Process  Coherency WDL  Content WDL  Delusions None reported or observed  Perception WDL  Hallucination None reported or observed  Judgment WDL  Confusion None  Danger to Self  Current suicidal ideation? Denies  Danger to Others  Danger to Others None reported or observed

## 2023-05-19 NOTE — Plan of Care (Signed)
  Problem: Education: Goal: Mental status will improve Outcome: Progressing   Problem: Education: Goal: Emotional status will improve Outcome: Progressing

## 2023-05-20 DIAGNOSIS — F39 Unspecified mood [affective] disorder: Secondary | ICD-10-CM | POA: Diagnosis not present

## 2023-05-20 LAB — LITHIUM LEVEL: Lithium Lvl: 0.26 mmol/L — ABNORMAL LOW (ref 0.60–1.20)

## 2023-05-20 MED ORDER — NICOTINE POLACRILEX 4 MG MT GUM: 4.0000 mg | CHEWING_GUM | OROMUCOSAL | 0 refills | Status: AC | PRN

## 2023-05-20 MED ORDER — LITHIUM CARBONATE ER 300 MG PO TBCR: 300.0000 mg | EXTENDED_RELEASE_TABLET | Freq: Two times a day (BID) | ORAL | 0 refills | Status: DC

## 2023-05-20 MED ORDER — TRAZODONE HCL 50 MG PO TABS: 50.0000 mg | ORAL_TABLET | Freq: Every evening | ORAL | 0 refills | Status: DC | PRN

## 2023-05-20 MED ORDER — HYDROXYZINE HCL 25 MG PO TABS: 25.0000 mg | ORAL_TABLET | Freq: Three times a day (TID) | ORAL | 0 refills | Status: AC | PRN

## 2023-05-20 NOTE — Discharge Summary (Signed)
Physician Discharge Summary Note  Patient:  Bernard Benton is an 27 y.o., male MRN:  [id number] DOB:  Aug 18, 1995 Patient phone:  [phone] (home)  Patient address:   [phone] [place] [facility] [address],    Date of Admission:  05/15/2023 Date of Discharge: 05/20/2023  Reason for Admission:  The patient is a 27 year old male presented to the ER with concerns about his mental health, specifically mentioning uncontrollable emotions and frequent arguments with his wife. He described feeling increasingly unable to manage his emotions, leading to verbal altercations and physical outbursts, such as punching a wall. These episodes have escalated to the point where he has contemplated suicide, though he has not acted on these thoughts due to his responsibilities towards his three children.   Principal Problem: Mood disorder [facility]) Discharge Diagnoses: Principal Problem:   Mood disorder (HCC) Active Problems:   PTSD (post-traumatic stress disorder)   Past Psychiatric History: Patient reports he has been diagnosed with depression - Previously prescribed fluoxetine by PCP - No prior psychiatric hospitalizations  -Denies past history of suicide attempts  Past Medical History:  Past Medical History:  Diagnosis Date   Acid reflux    Depressed     Past Surgical History:  Procedure Laterality Date   HIP SURGERY     Family History: Patient reports his father has depression    Social History:  Social History   Substance and Sexual Activity  Alcohol Use Not Currently   Comment: occassionally     Social History   Substance and Sexual Activity  Drug Use Yes   Types: Marijuana   Comment: occassional    Social History   Socioeconomic History   Marital status: Married    Spouse name: Not on file   Number of children: Not on file   Years of education: Not on file   Highest education level: Not on file  Occupational History   Not on file  Tobacco Use   Smoking status:  Former   Smokeless tobacco: Never  Vaping Use   Vaping status: Never Used  Substance and Sexual Activity   Alcohol use: Not Currently    Comment: occassionally   Drug use: Yes    Types: Marijuana    Comment: occassional   Sexual activity: Not on file  Other Topics Concern   Not on file  Social History Narrative   Not on file   Social Determinants of Health   Financial Resource Strain: Not on file  Food Insecurity: No Food Insecurity (05/15/2023)   Hunger Vital Sign    Worried About Running Out of Food in the Last Year: Never true    Ran Out of Food in the Last Year: Never true  Transportation Needs: No Transportation Needs (05/15/2023)   PRAPARE - Administrator, Civil Service (Medical): No    Lack of Transportation (Non-Medical): No  Physical Activity: Not on file  Stress: Not on file  Social Connections: Not on file    Hospital Course:   The patient was admitted to Inpatient psychiatric treatment for stabilization of depression and suicide thoughts. Patient was placed on suicidal precautions. The patient was evaluated and treated by the multidisciplinary treatment team including physicians, nurses, social workers and therapists. All medications were presented to the patient and the Patient gave consent to all the medications that they were given, as well as was explained the risks, benefits, side effects and alternatives of all medication therapies. The patient was integrated into the general  milieu on the ward and encouraged to attend to his ADLs and participate in all groups and activities. During hospital course the Patient attended coping skill groups, music therapy and activity therapy groups. Patient was counseled on cognitive techniques/skills by multiple staff members and given support care by the staff.   Patient's medication regimen was evaluated and titrated to therapeutic levels to better Patient's overall daily functioning. Specifically, the patient was  started on lithium for mood stabilization.  Lithium level drawn today came back at 0.26.  Patient is being discharged today, he does not want to change the dose at this point in time.  Patient will discuss the need of increasing the lithium dose with outpatient provider.  Patient agrees with the plan.  He tolerated the medication well with no significant side effects.  During the hospitalization, the patient demonstrated a stabilization of mood with decreased racing thoughts, decreased impulsivity, improved sleep and decreased irritability. At the time of discharge, the patient denied any suicidal ideation/homicidal ideation and was not overtly depressed, manic or psychotic. The Patient was interacting well in groups and on the unit with their peers. Patient was able to identify a safety plan to include speaking with family, contacting outpatient provider or calling 911 if hallucinations/delusions returned or worsened or thoughts of self-harm or suicide return. Patient was counselled   Musculoskeletal: Strength & Muscle Tone: within normal limits Gait & Station: normal Patient leans: N/A   Psychiatric Specialty Exam:   Presentation  General Appearance:  Appropriate for Environment   Eye Contact: Fair   Speech: Clear and Coherent   Speech Volume: Normal   Handedness: Right     Mood and Affect  Mood: " good"   Affect: Congruent     Thought Process  Thought Processes: Coherent   Descriptions of Associations:Circumstantial   Orientation:Full (Time, Place and Person)   Thought Content:Logical   History of Schizophrenia/Schizoaffective disorder:No   Duration of Psychotic Symptoms:NA   Hallucinations:Hallucinations: None   Ideas of Reference:None   Suicidal Thoughts: Patient denies suicide thoughts, no intentions or plan   Homicidal Thoughts: No, no intentions or plan     Sensorium  Memory: Immediate Good; Recent Good; Remote Good   Judgment: Fair    Insight: Fair     Art therapist  Concentration: Good   Attention Span: Good   Recall: Good   Fund of Knowledge: Good   Language: Good     Psychomotor Activity  Psychomotor Activity: Psychomotor Activity: Normal     Assets  Assets: Communication Skills; Housing     Sleep  Sleep: Sleep: Good       Physical Exam: Physical Exam HENT:     Head: Normocephalic and atraumatic.     Nose: Nose normal.     Mouth/Throat:     Mouth: Mucous membranes are moist.  Eyes:     Pupils: Pupils are equal, round, and reactive to light.  Cardiovascular:     Rate and Rhythm: Regular rhythm.  Pulmonary:     Effort: Pulmonary effort is normal.  Skin:    General: Skin is warm.  Neurological:     Mental Status: He is alert and oriented to person, place, and time.      Review of Systems  Constitutional:  Negative for chills and fever.  HENT:  Negative for congestion, hearing loss and sore throat.   Eyes:  Negative for blurred vision and double vision.  Respiratory:  Negative for cough and shortness of breath.   Cardiovascular:  Negative for palpitations and leg swelling.  Gastrointestinal: Denies nausea/ vomiting  Blood pressure 133/77, pulse (!) 53, temperature 97.7 F (36.5 C), temperature source Oral, resp. rate 20, height 6\' 2"  (1.88 m), weight 104.3 kg, SpO2 100%. Body mass index is 29.53 kg/m.   Social History   Tobacco Use  Smoking Status Former  Smokeless Tobacco Never   Tobacco Cessation:  A prescription for an FDA-approved tobacco cessation medication provided at discharge   Blood Alcohol level:  Lab Results  Component Value Date   [facility] <10 05/14/2023      See Psychiatric Specialty Exam and Suicide Risk Assessment completed by Attending Physician prior to discharge.  Discharge destination:  Home  Is patient on multiple antipsychotic therapies at discharge:  No    Recommended Plan for Multiple Antipsychotic Therapies: NA   Allergies as  of 05/20/2023       Reactions   Other    Pt reports allergic to an IV ABX but does not remember which one        Medication List     STOP taking these medications    dicyclomine 20 MG tablet Commonly known as: BENTYL   metoCLOPramide 10 MG tablet Commonly known as: REGLAN   ondansetron 4 MG disintegrating tablet Commonly known as: Zofran ODT   promethazine 25 MG tablet Commonly known as: PHENERGAN   ranitidine 150 MG capsule Commonly known as: ZANTAC   sucralfate 1 g tablet Commonly known as: Carafate       TAKE these medications      Indication  hydrOXYzine 25 MG tablet Commonly known as: ATARAX Take 1 tablet (25 mg total) by mouth 3 (three) times daily as needed for anxiety.  Indication: Feeling Anxious   lithium carbonate 300 MG ER tablet Commonly known as: LITHOBID Take 1 tablet (300 mg total) by mouth every 12 (twelve) hours.  Indication: mood   nicotine polacrilex 4 MG gum Commonly known as: NICORETTE Take 1 each (4 mg total) by mouth as needed for smoking cessation.  Indication: Nicotine Addiction   traZODone 50 MG tablet Commonly known as: DESYREL Take 1 tablet (50 mg total) by mouth at bedtime as needed for sleep.  Indication: Trouble Sleeping        Follow-up Information     [facility] Follow up.   Why: Appointment is 06/18/23 at 11AM. Contact information: [address] [place] [address] [phone]                PATIENTS CONDITION AT DISCHARGE:  Stable  TOBACCO CESSATION SCREENING  Patient was screened and counselled on smoking cessation at time of discharge.    PRESCRIPTION ARE LOCATED:  On Chart   DISCHARGE INSTRUCTIONS:  1. Diet: Regular  2. Activity: As tolerated  3. Take medications as prescribed and not to make any changes without first consulting with the outpatient provider.  4. Patient was advised to avoid any illicit drugs or alcohol due to negative impact on  physical and mental health.  5. Patient should keep all follow up appointments.   TIME SPENT ON DISCHARGE: Over 35 minutes were spent on this patients discharge including a face to face encounter, patient counseling and preparation of discharge materials.   Signed: Lewanda Rife, MD 05/20/2023, 11:46 AM

## 2023-05-20 NOTE — Plan of Care (Signed)

## 2023-05-20 NOTE — BHH Suicide Risk Assessment (Signed)
[facility] Discharge Suicide Risk Assessment   Principal Problem: Mood disorder [facility]) Discharge Diagnoses: Principal Problem:   Mood disorder (HCC) Active Problems:   PTSD (post-traumatic stress disorder)    Musculoskeletal: Strength & Muscle Tone: within normal limits Gait & Station: normal Patient leans: N/A   Psychiatric Specialty Exam:   Presentation  General Appearance:  Appropriate for Environment   Eye Contact: Fair   Speech: Clear and Coherent   Speech Volume: Normal   Handedness: Right     Mood and Affect  Mood: " good"   Affect: Congruent     Thought Process  Thought Processes: Coherent   Descriptions of Associations:Circumstantial   Orientation:Full (Time, Place and Person)   Thought Content:Logical   History of Schizophrenia/Schizoaffective disorder:No   Duration of Psychotic Symptoms:NA   Hallucinations:Hallucinations: None   Ideas of Reference:None   Suicidal Thoughts: Patient denies suicide thoughts, no intentions or plan   Homicidal Thoughts: No, no intentions or plan     Sensorium  Memory: Immediate Good; Recent Good; Remote Good   Judgment: Fair   Insight: Fair     Art therapist  Concentration: Good   Attention Span: Good   Recall: Good   Fund of Knowledge: Good   Language: Good     Psychomotor Activity  Psychomotor Activity: Psychomotor Activity: Normal     Assets  Assets: Communication Skills; Housing     Sleep  Sleep: Sleep: Good       Physical Exam: Physical Exam HENT:     Head: Normocephalic and atraumatic.     Nose: Nose normal.     Mouth/Throat:     Mouth: Mucous membranes are moist.  Eyes:     Pupils: Pupils are equal, round, and reactive to light.  Cardiovascular:     Rate and Rhythm: Regular rhythm.  Pulmonary:     Effort: Pulmonary effort is normal.  Skin:    General: Skin is warm.  Neurological:     Mental Status: He is alert and oriented to person, place, and  time.      Review of Systems  Constitutional:  Negative for chills and fever.  HENT:  Negative for congestion, hearing loss and sore throat.   Eyes:  Negative for blurred vision and double vision.  Respiratory:  Negative for cough and shortness of breath.   Cardiovascular:  Negative for palpitations and leg swelling.  Gastrointestinal: Denies nausea/ vomiting  Blood pressure 133/77, pulse (!) 53, temperature 97.7 F (36.5 C), temperature source Oral, resp. rate 20, height 6\' 2"  (1.88 m), weight 104.3 kg, SpO2 100%. Body mass index is 29.53 kg/m.   Demographic Factors:  Male and Caucasian  Loss Factors: NA  Historical Factors: Impulsivity  Risk Reduction Factors:   Responsible for children under 50 years of age, Sense of responsibility to family, Positive social support, Positive therapeutic relationship, and Positive coping skills or problem solving skills  Continued Clinical Symptoms:  Alcohol/Substance Abuse/Dependencies Previous Psychiatric Diagnoses and Treatments  Cognitive Features That Contribute To Risk:  Thought constriction (tunnel vision)    Suicide Risk:  Minimal: No identifiable suicidal ideation.      Follow-up Information     [facility] Follow up.   Why: Appointment is 06/18/23 at 11AM. Contact information: [address] [place] [address] [phone]                 Plan Of Care/Follow-up recommendations:  Per Discharge Summary  Lewanda Rife, MD

## 2023-05-20 NOTE — Progress Notes (Signed)
  [facility] Adult Case Management Discharge Plan :  Will you be returning to the same living situation after discharge:  No. Patient will be discharging to his father's home.  At discharge, do you have transportation home?: Yes,  Father will provide transportation.  Do you have the ability to pay for your medications: Yes,  Patient reports [facility].  Release of information consent forms completed and in the chart;  Patient's signature needed at discharge.  Patient to Follow up at:  Follow-up Information     [facility] Follow up.   Why: Appointment is 06/18/23 at 11AM. Contact information: [address] [place] [address] [phone]                 Next level of care provider has access to [facility] Link:no  Safety Planning and Suicide Prevention discussed: Yes,  CSW completed with patient's wife.      Has patient been referred to the Quitline?: Patient refused referral for treatment  Patient has been referred for addiction treatment: Patient refused referral for treatment.  Lowry Ram, LCSW 05/20/2023, 11:07 AM

## 2023-05-20 NOTE — Group Note (Signed)
Recreation Therapy Group Note   Group Topic:Problem Solving  Group Date: 05/20/2023 Start Time: 1000 End Time: 1110 Facilitators: Clinton Gallant, CTRS Location:  Craft Room  Group Description: Life Boat. Patients were given the scenario that they are on a boat that is about to become shipwrecked, leaving them stranded on an [place]. They are asked to make a list of 15 different items that they want to take with them when they are stranded on the [place]. Patients are asked to rank their items from most important to least important, #1 being the most important and #15 being the least. Patients will work individually for the first round to come up with 15 items and then pair up with a peer(s) to condense their list and come up with one list of 15 items between the two of them. Patients or LRT will read aloud the 15 different items to the group after each round. LRT facilitated post-activity processing to discuss how this activity can be used in daily life post discharge.   Goal Area(s) Addressed:  Patient will identify priorities, wants and needs. Patient will communicate with LRT and peers. Patient will work collectively as a Administrator, Civil Service. Patient will work on Product manager.    Affect/Mood: Appropriate   Participation Level: Active and Engaged   Participation Quality: Independent   Behavior: Calm and Cooperative   Speech/Thought Process: Coherent   Insight: Good   Judgement: Good   Modes of Intervention: Activity   Patient Response to Interventions:  Attentive, Engaged, Interested , and Receptive   Education Outcome:  Acknowledges education   Clinical Observations/Individualized Feedback: Bernard Benton was active in their participation of session activities and group discussion. Pt identified "water filter, tarp, tv, xbox, generator" as things he will bring with him on the [place]. Pt interacted well with LRT and peers duration of session.    Plan: Continue to engage patient  in RT group sessions 2-3x/week.   Rosina Lowenstein, LRT, CTRS 05/20/2023 12:11 PM

## 2023-05-20 NOTE — Progress Notes (Signed)
Patient ID: Bernard Benton, male   DOB: [birth year], 27 y.o.   MRN: [id number]  Discharge Note:  Patient denies SI/HI/AVH at this time. Discharge instructions, AVS, prescriptions, return to work note, and transition record gone over with patient. Patient given a copy of his Suicide Safety Plan. Patient agrees to comply with medication management, follow-up visit, and outpatient therapy. Patient belongings returned to patient. Patient questions and concerns addressed and answered. Patient ambulatory off unit. Patient discharged to home with his Dad.

## 2023-05-20 NOTE — Plan of Care (Signed)

## 2023-05-20 NOTE — Progress Notes (Signed)
D- Patient alert and oriented. Patient presented in a pleasant mood on assessment reporting that he slept fair last night and had no complaints to voice to this Clinical research associate. Patient denies SI, HI, AVH, and pain at this time. Patient also denies any signs/symptoms of depression and anxiety, stating "I'm doing good", overall. Patient's goal for today, per his self-inventory is "trying to help other patients out", in which he will "stay calm and rely on my friends", in order to achieve his goal.  A- Scheduled medications administered to patient, per MD orders. Support and encouragement provided. Routine safety checks conducted every 15 minutes. Patient informed to notify staff with problems or concerns.  R- No adverse drug reactions noted. Patient contracts for safety at this time. Patient compliant with medications and treatment plan. Patient receptive, calm, and cooperative. Patient interacts well with others on the unit. Patient remains safe at this time.

## 2023-06-11 ENCOUNTER — Ambulatory Visit: Payer: Self-pay | Admitting: Physician Assistant

## 2023-06-23 ENCOUNTER — Ambulatory Visit
Admission: EM | Admit: 2023-06-23 | Discharge: 2023-06-23 | Disposition: A | Discharge status: Home / Self Care | Payer: BC Managed Care – PPO | Attending: Emergency Medicine | Admitting: Emergency Medicine

## 2023-06-23 ENCOUNTER — Encounter: Payer: Self-pay | Admitting: *Deleted

## 2023-06-23 DIAGNOSIS — H6502 Acute serous otitis media, left ear: Secondary | ICD-10-CM

## 2023-06-23 MED ORDER — BENZONATATE 100 MG PO CAPS: 100.0000 mg | ORAL_CAPSULE | Freq: Three times a day (TID) | ORAL | 0 refills | Status: DC

## 2023-06-23 MED ORDER — PREDNISONE 10 MG (21) PO TBPK: ORAL_TABLET | Freq: Every day | ORAL | 0 refills | Status: DC

## 2023-06-23 MED ORDER — AMOXICILLIN-POT CLAVULANATE 875-125 MG PO TABS: 1.0000 | ORAL_TABLET | Freq: Two times a day (BID) | ORAL | 0 refills | Status: DC

## 2023-06-23 MED ORDER — PROMETHAZINE-DM 6.25-15 MG/5ML PO SYRP: 5.0000 mL | ORAL_SOLUTION | Freq: Four times a day (QID) | ORAL | 0 refills | Status: DC | PRN

## 2023-06-23 NOTE — ED Triage Notes (Signed)
Patient states coug/congestion, sinus pressure and ear pain on left for about 4 days.  Taking OTC Dayquil with little relief

## 2023-06-23 NOTE — Discharge Instructions (Signed)
Today you are being treated for an infection of the eardrum  Take augmentin  twice daily for 7 days, you should begin to see improvement after 48 hours of medication use and then it should progressively get better  Sinus pressure and chest soreness  starting tomorrow take prednisone every morning with food as directed  You may use Tessalon pill every 8 hours to help calm your coughing, may use cough syrup every 6 hours to help with coughing and nausea, please be mindful the cough syrup can make you feel sleepy  You may use Tylenol or ibuprofen for management of discomfort  May hold warm compresses to the ear for additional comfort  Please not attempted any ear cleaning or object or fluid placement into the ear canal to prevent further irritation   For cough: honey 1/2 to 1 teaspoon (you can dilute the honey in water or another fluid).  You can also use guaifenesin and dextromethorphan for cough. You can use a humidifier for chest congestion and cough.  If you don't have a humidifier, you can sit in the bathroom with the hot shower running.      For sore throat: try warm salt water gargles, cepacol lozenges, throat spray, warm tea or water with lemon/honey, popsicles or ice, or OTC cold relief medicine for throat discomfort.   For congestion: take a daily anti-histamine like Zyrtec, Claritin, and a oral decongestant, such as pseudoephedrine.  You can also use Flonase 1-2 sprays in each nostril daily.   It is important to stay hydrated: drink plenty of fluids (water, gatorade/powerade/pedialyte, juices, or teas) to keep your throat moisturized and help further relieve irritation/discomfort.

## 2023-06-26 ENCOUNTER — Telehealth: Payer: Self-pay | Admitting: Emergency Medicine

## 2023-06-26 MED ORDER — AZITHROMYCIN 250 MG PO TABS: 250.0000 mg | ORAL_TABLET | Freq: Every day | ORAL | 0 refills | Status: DC

## 2023-06-26 NOTE — ED Provider Notes (Signed)
Renaldo Fiddler    CSN: [id number] Arrival date & time: 06/23/23  1446      History   Chief Complaint Chief Complaint  Patient presents with   Cough    HPI Bernard Benton is a 27 y.o. male.   Presents for evaluation of chills, nasal congestion, rhinorrhea, sinus pain and pressure, centralized chest shortness, intermittent generalized headaches and left ear pain present for 4 days.  Associated nausea without vomiting.  Decreased appetite but able to tolerate some food and liquids.  Known sick contacts.  Has attempted use of DayQuil and nasal spray.  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 Authorizing Provider  amoxicillin-clavulanate (AUGMENTIN) 875-125 MG tablet Take 1 tablet by mouth every 12 (twelve) hours. 06/23/23  Yes Kayle Passarelli R, NP  benzonatate (TESSALON) 100 MG capsule Take 1 capsule (100 mg total) by mouth every 8 (eight) hours. 06/23/23  Yes Wynne Jury R, NP  predniSONE (STERAPRED UNI-PAK 21 TAB) 10 MG (21) TBPK tablet Take by mouth daily. Take 6 tabs by mouth daily  for 1 days, then 5 tabs for 1 days, then 4 tabs for 1 days, then 3 tabs for 1 days, 2 tabs for 1 days, then 1 tab by mouth daily for 1 days 06/23/23  Yes Devorah Givhan R, NP  promethazine-dextromethorphan (PROMETHAZINE-DM) 6.25-15 MG/5ML syrup Take 5 mLs by mouth 4 (four) times daily as needed for cough. 06/23/23  Yes Itamar Mcgowan, Elita Boone, NP  hydrOXYzine (ATARAX) 25 MG tablet Take 1 tablet (25 mg total) by mouth 3 (three) times daily as needed for anxiety.  05/20/23   Lewanda Rife, MD  lithium carbonate (LITHOBID) 300 MG ER tablet Take 1 tablet (300 mg total) by mouth every 12 (twelve) hours. 05/20/23   Lewanda Rife, MD  nicotine polacrilex (NICORETTE) 4 MG gum Take 1 each (4 mg total) by mouth as needed for smoking cessation. 05/20/23   Lewanda Rife, MD  traZODone (DESYREL) 50 MG tablet Take 1 tablet (50 mg total) by mouth at bedtime as needed for sleep. 05/20/23   Lewanda Rife, MD    Family History History reviewed. No pertinent family history.  Social History Social History   Tobacco Use   Smoking status: Former   Smokeless tobacco: Never  Advertising account planner   Vaping status: Every Day  Substance Use Topics   Alcohol use: Not Currently    Comment: occassionally   Drug use: Yes    Types: Marijuana    Comment: occassional     Allergies   Other   Review of Systems Review of Systems  Respiratory:  Positive for cough.      Physical Exam Triage Vital Signs ED Triage Vitals  Encounter Vitals Group     BP 06/23/23 1526 125/82     Systolic BP Percentile --      Diastolic BP Percentile --      Pulse Rate 06/23/23 1526 78     Resp 06/23/23 1526 18  Temp 06/23/23 1526 97.8 F (36.6 C)     Temp Source 06/23/23 1526 Oral     SpO2 06/23/23 1526 100 %     Weight 06/23/23 1538 240 lb (108.9 kg)     Height 06/23/23 1538 6\' 2"  (1.88 m)     Head Circumference --      Peak Flow --      Pain Score 06/23/23 1538 4     Pain Loc --      Pain Education --      Exclude from Growth Chart --    No data found.  Updated Vital Signs BP 125/82 (BP Location: Left Arm)   Pulse 78   Temp 97.8 F (36.6 C) (Oral)   Resp 18   Ht 6\' 2"  (1.88 m)   Wt 240 lb (108.9 kg)   SpO2 100%   BMI 30.81 kg/m   Visual Acuity Right Eye Distance:   Left Eye Distance:   Bilateral Distance:    Right Eye Near:   Left Eye Near:    Bilateral Near:     Physical Exam Constitutional:      Appearance: Normal appearance.  HENT:     Head:  Normocephalic.     Right Ear: Tympanic membrane, ear canal and external ear normal.     Left Ear: Hearing, ear canal and external ear normal. Tympanic membrane is erythematous.     Nose: Congestion present. No rhinorrhea.     Mouth/Throat:     Mouth: Mucous membranes are moist.     Pharynx: Oropharynx is clear. No oropharyngeal exudate or posterior oropharyngeal erythema.  Eyes:     Extraocular Movements: Extraocular movements intact.  Cardiovascular:     Rate and Rhythm: Normal rate and regular rhythm.     Pulses: Normal pulses.     Heart sounds: Normal heart sounds.  Pulmonary:     Effort: Pulmonary effort is normal.     Breath sounds: Normal breath sounds.  Musculoskeletal:     Cervical back: Normal range of motion and neck supple.  Neurological:     Mental Status: He is alert and oriented to person, place, and time. Mental status is at baseline.      UC Treatments / Results  Labs (all labs ordered are listed, but only abnormal results are displayed) Labs Reviewed - No data to display  EKG   Radiology No results found.  Procedures Procedures (including critical care time)  Medications Ordered in UC Medications - No data to display  Initial Impression / Assessment and Plan / UC Course  I have reviewed the triage vital signs and the nursing notes.  Pertinent labs & imaging results that were available during my care of the patient were reviewed by me and considered in my medical decision making (see chart for details).  Nonrecurrent acute serous otitis media of the left ear  Erythema to the tympanic membrane is consistent with infection, congestion to the nasal turbinates otherwise stable exam, prescribed amoxicillin management of the ear, lungs are clear to auscultation low suspicion for pneumonia or bronchitis however as patient endorses chest soreness will prescribe prednisone, Tessalon and Promethazine DM additionally.advised against ear cleaning, may use  over-the-counter analgesics and warm compresses to the external ear for comfort, may follow-up if symptoms persist worsen or recur  Final Clinical Impressions(s) / UC Diagnoses   Final diagnoses:  Non-recurrent acute serous otitis media of left ear     Discharge Instructions      Today you  are being treated for an infection of the eardrum  Take augmentin  twice daily for 7 days, you should begin to see improvement after 48 hours of medication use and then it should progressively get better  Sinus pressure and chest soreness  starting tomorrow take prednisone every morning with food as directed  You may use Tessalon pill every 8 hours to help calm your coughing, may use cough syrup every 6 hours to help with coughing and nausea, please be mindful the cough syrup can make you feel sleepy  You may use Tylenol or ibuprofen for management of discomfort  May hold warm compresses to the ear for additional comfort  Please not attempted any ear cleaning or object or fluid placement into the ear canal to prevent further irritation   For cough: honey 1/2 to 1 teaspoon (you can dilute the honey in water or another fluid).  You can also use guaifenesin and dextromethorphan for cough. You can use a humidifier for chest congestion and cough.  If you don't have a humidifier, you can sit in the bathroom with the hot shower running.      For sore throat: try warm salt water gargles, cepacol lozenges, throat spray, warm tea or water with lemon/honey, popsicles or ice, or OTC cold relief medicine for throat discomfort.   For congestion: take a daily anti-histamine like Zyrtec, Claritin, and a oral decongestant, such as pseudoephedrine.  You can also use Flonase 1-2 sprays in each nostril daily.   It is important to stay hydrated: drink plenty of fluids (water, gatorade/powerade/pedialyte, juices, or teas) to keep your throat moisturized and help further relieve irritation/discomfort.    ED  Prescriptions     Medication Sig Dispense Auth. Provider   amoxicillin-clavulanate (AUGMENTIN) 875-125 MG tablet Take 1 tablet by mouth every 12 (twelve) hours. 14 tablet Maricia Scotti R, NP   predniSONE (STERAPRED UNI-PAK 21 TAB) 10 MG (21) TBPK tablet Take by mouth daily. Take 6 tabs by mouth daily  for 1 days, then 5 tabs for 1 days, then 4 tabs for 1 days, then 3 tabs for 1 days, 2 tabs for 1 days, then 1 tab by mouth daily for 1 days 21 tablet Fritz Cauthon R, NP   benzonatate (TESSALON) 100 MG capsule Take 1 capsule (100 mg total) by mouth every 8 (eight) hours. 21 capsule Sem Mccaughey R, NP   promethazine-dextromethorphan (PROMETHAZINE-DM) 6.25-15 MG/5ML syrup Take 5 mLs by mouth 4 (four) times daily as needed for cough. 118 mL Stephanieann Popescu, Elita Boone, NP      PDMP not reviewed this encounter.   Valinda Hoar, NP 06/26/23 [phone]

## 2023-06-26 NOTE — Telephone Encounter (Signed)
Patient return to clinic endorsing that Augmentin is causing generalized abdominal pain and therefore discontinue medicine, azithromycin prescribed as an alternative

## 2023-07-01 ENCOUNTER — Emergency Department
Admission: EM | Admit: 2023-07-01 | Discharge: 2023-07-01 | Disposition: A | Discharge status: Home / Self Care | Payer: BC Managed Care – PPO | Attending: Emergency Medicine | Admitting: Emergency Medicine

## 2023-07-01 ENCOUNTER — Emergency Department: Payer: BC Managed Care – PPO

## 2023-07-01 ENCOUNTER — Encounter: Payer: Self-pay | Admitting: Emergency Medicine

## 2023-07-01 ENCOUNTER — Other Ambulatory Visit: Payer: Self-pay

## 2023-07-01 DIAGNOSIS — R197 Diarrhea, unspecified: Secondary | ICD-10-CM | POA: Insufficient documentation

## 2023-07-01 DIAGNOSIS — R109 Unspecified abdominal pain: Secondary | ICD-10-CM

## 2023-07-01 DIAGNOSIS — R1012 Left upper quadrant pain: Secondary | ICD-10-CM | POA: Diagnosis present

## 2023-07-01 DIAGNOSIS — R112 Nausea with vomiting, unspecified: Secondary | ICD-10-CM | POA: Diagnosis not present

## 2023-07-01 DIAGNOSIS — R1110 Vomiting, unspecified: Secondary | ICD-10-CM

## 2023-07-01 DIAGNOSIS — K2980 Duodenitis without bleeding: Secondary | ICD-10-CM

## 2023-07-01 LAB — CBC WITH DIFFERENTIAL/PLATELET
Abs Immature Granulocytes: 0.09 10*3/uL — ABNORMAL HIGH (ref 0.00–0.07)
Basophils Absolute: 0.1 10*3/uL (ref 0.0–0.1)
Basophils Relative: 1 %
Eosinophils Absolute: 0.2 10*3/uL (ref 0.0–0.5)
Eosinophils Relative: 1 %
HCT: 45.1 % (ref 39.0–52.0)
Hemoglobin: 15.6 g/dL (ref 13.0–17.0)
Immature Granulocytes: 1 %
Lymphocytes Relative: 15 %
Lymphs Abs: 2.4 10*3/uL (ref 0.7–4.0)
MCH: 31.1 pg (ref 26.0–34.0)
MCHC: 34.6 g/dL (ref 30.0–36.0)
MCV: 90 fL (ref 80.0–100.0)
Monocytes Absolute: 0.8 10*3/uL (ref 0.1–1.0)
Monocytes Relative: 5 %
Neutro Abs: 12.6 10*3/uL — ABNORMAL HIGH (ref 1.7–7.7)
Neutrophils Relative %: 77 %
Platelets: 227 10*3/uL (ref 150–400)
RBC: 5.01 MIL/uL (ref 4.22–5.81)
RDW: 12.1 % (ref 11.5–15.5)
WBC: 16.1 10*3/uL — ABNORMAL HIGH (ref 4.0–10.5)
nRBC: 0 % (ref 0.0–0.2)

## 2023-07-01 LAB — TROPONIN I (HIGH SENSITIVITY)
Troponin I (High Sensitivity): 3 ng/L (ref <18)
Troponin I (High Sensitivity): 4 ng/L (ref <18)

## 2023-07-01 LAB — COMPREHENSIVE METABOLIC PANEL
ALT: 31 U/L (ref 0–44)
AST: 18 U/L (ref 15–41)
Albumin: 4.1 g/dL (ref 3.5–5.0)
Alkaline Phosphatase: 73 U/L (ref 38–126)
Anion gap: 14 (ref 5–15)
BUN: 11 mg/dL (ref 6–20)
CO2: 22 mmol/L (ref 22–32)
Calcium: 9.2 mg/dL (ref 8.9–10.3)
Chloride: 102 mmol/L (ref 98–111)
Creatinine, Ser: 0.87 mg/dL (ref 0.61–1.24)
GFR, Estimated: >60 mL/min (ref >60)
Glucose, Bld: 109 mg/dL — ABNORMAL HIGH (ref 70–99)
Potassium: 4.1 mmol/L (ref 3.5–5.1)
Sodium: 138 mmol/L (ref 135–145)
Total Bilirubin: 0.6 mg/dL (ref <1.2)
Total Protein: 7.7 g/dL (ref 6.5–8.1)

## 2023-07-01 LAB — LIPASE, BLOOD: Lipase: 24 U/L (ref 11–51)

## 2023-07-01 LAB — LITHIUM LEVEL: Lithium Lvl: <0.06 mmol/L — ABNORMAL LOW (ref 0.60–1.20)

## 2023-07-01 MED ORDER — ONDANSETRON 8 MG PO TBDP: 8.0000 mg | ORAL_TABLET | Freq: Three times a day (TID) | ORAL | 0 refills | Status: DC | PRN

## 2023-07-01 MED ORDER — ONDANSETRON HCL 4 MG/2ML IJ SOLN
4.0000 mg | Freq: Once | INTRAMUSCULAR | Status: AC
  Administered 2023-07-01: 4 mg via INTRAVENOUS
  Filled 2023-07-01: qty 2

## 2023-07-01 MED ORDER — KETOROLAC TROMETHAMINE 15 MG/ML IJ SOLN
15.0000 mg | Freq: Once | INTRAMUSCULAR | Status: AC
  Administered 2023-07-01: 15 mg via INTRAVENOUS
  Filled 2023-07-01: qty 1

## 2023-07-01 MED ORDER — ONDANSETRON 4 MG PO TBDP: 4.0000 mg | ORAL_TABLET | Freq: Three times a day (TID) | ORAL | 0 refills | Status: DC | PRN

## 2023-07-01 MED ORDER — MORPHINE SULFATE (PF) 4 MG/ML IV SOLN
4.0000 mg | Freq: Once | INTRAVENOUS | Status: AC
  Administered 2023-07-01: 4 mg via INTRAVENOUS
  Filled 2023-07-01: qty 1

## 2023-07-01 MED ORDER — PANTOPRAZOLE SODIUM 40 MG IV SOLR
40.0000 mg | Freq: Once | INTRAVENOUS | Status: AC
  Administered 2023-07-01: 40 mg via INTRAVENOUS
  Filled 2023-07-01: qty 10

## 2023-07-01 MED ORDER — FAMOTIDINE 20 MG PO TABS: 20.0000 mg | ORAL_TABLET | Freq: Two times a day (BID) | ORAL | 0 refills | Status: DC

## 2023-07-01 MED ORDER — ALUM & MAG HYDROXIDE-SIMETH 200-200-20 MG/5ML PO SUSP
30.0000 mL | Freq: Once | ORAL | Status: AC
Start: 2023-07-01 — End: 2023-07-01
  Administered 2023-07-01: 30 mL via ORAL
  Filled 2023-07-01: qty 30

## 2023-07-01 MED ORDER — SUCRALFATE 1 G PO TABS: 1.0000 g | ORAL_TABLET | Freq: Four times a day (QID) | ORAL | 0 refills | Status: DC

## 2023-07-01 MED ORDER — SODIUM CHLORIDE 0.9 % IV BOLUS
1000.0000 mL | Freq: Once | INTRAVENOUS | Status: AC
  Administered 2023-07-01: 1000 mL via INTRAVENOUS

## 2023-07-01 MED ORDER — LIDOCAINE VISCOUS HCL 2 % MT SOLN
15.0000 mL | Freq: Once | OROMUCOSAL | Status: AC
  Administered 2023-07-01: 15 mL via OROMUCOSAL
  Filled 2023-07-01: qty 15

## 2023-07-01 NOTE — ED Provider Notes (Signed)
[facility] Provider Note    Event Date/Time   First MD Initiated Contact with Patient 07/01/23 1219     (approximate)   History   Chief Complaint: Chest Pain   HPI  Bernard Benton is a 27 y.o. male with a history of depression who comes ED complaining of left upper abdominal pain that is been ongoing for the past week.  Was prescribed amoxicillin but only took it for 3 days because his symptoms seem to be worsening with it.  Also been having nausea and vomiting and diarrhea, no black or bloody stool.  Endorses chills, no fever.          Physical Exam   Triage Vital Signs: ED Triage Vitals  Encounter Vitals Group     BP 07/01/23 0718 (!) 142/89     Systolic BP Percentile --      Diastolic BP Percentile --      Pulse Rate 07/01/23 0718 (!) 112     Resp 07/01/23 0718 (!) 21     Temp 07/01/23 0718 98.1 F (36.7 C)     Temp Source 07/01/23 0718 Oral     SpO2 07/01/23 0718 100 %     Weight 07/01/23 0713 245 lb (111.1 kg)     Height 07/01/23 0713 6\' 2"  (1.88 m)     Head Circumference --      Peak Flow --      Pain Score 07/01/23 0713 7     Pain Loc --      Pain Education --      Exclude from Growth Chart --     Most recent vital signs: Vitals:   07/01/23 1330 07/01/23 1400  BP: (!) 152/96 138/87  Pulse: (!) 44 (!) 53  Resp:    Temp:    SpO2: 100% 98%    General: Awake, no distress.  CV:  Good peripheral perfusion.  Regular rate rhythm Resp:  Normal effort.  Clear to auscultation bilaterally Abd:  No distention.  Soft with left upper quadrant and left flank tenderness Other:  Somewhat dry oral mucosa   ED Results / Procedures / Treatments   Labs (all labs ordered are listed, but only abnormal results are displayed) Labs Reviewed  COMPREHENSIVE METABOLIC PANEL - Abnormal; Notable for the following components:      Result Value   Glucose, Bld 109 (*)    All other components within normal limits  CBC WITH DIFFERENTIAL/PLATELET  - Abnormal; Notable for the following components:   WBC 16.1 (*)    Neutro Abs 12.6 (*)    Abs Immature Granulocytes 0.09 (*)    All other components within normal limits  LIPASE, BLOOD  TROPONIN I (HIGH SENSITIVITY)  TROPONIN I (HIGH SENSITIVITY)     EKG Interpreted by me Sinus bradycardia rate of 55, normal axis intervals QRS ST segments and T waves   RADIOLOGY Chest x-ray interpreted by me, unremarkable.  Radiology report reviewed.  CT abdomen pelvis pending   PROCEDURES:  Procedures   MEDICATIONS ORDERED IN ED: Medications  sodium chloride 0.9 % bolus 1,000 mL (1,000 mLs Intravenous New Bag/Given 07/01/23 1252)  ondansetron (ZOFRAN) injection 4 mg (4 mg Intravenous Given 07/01/23 1251)  pantoprazole (PROTONIX) injection 40 mg (40 mg Intravenous Given 07/01/23 1251)  ketorolac (TORADOL) 15 MG/ML injection 15 mg (15 mg Intravenous Given 07/01/23 1252)     IMPRESSION / MDM / ASSESSMENT AND PLAN / ED COURSE  I reviewed the triage vital signs  and the nursing notes.  DDx: GERD/gastritis, viral gastroenteritis, pneumothorax, pneumonia, kidney stone, diverticulitis, pancreatitis  Patient's presentation is most consistent with acute presentation with potential threat to life or bodily function.  Patient presents with left upper abdominal pain with tenderness.  Will give Zofran Protonix Toradol, IV fluids, check labs and CT.   ----------------------------------------- 3:11 PM on 07/01/2023 ----------------------------------------- Labs reassuring.  Awaiting CT      FINAL CLINICAL IMPRESSION(S) / ED DIAGNOSES   Final diagnoses:  Left flank pain  Vomiting and diarrhea     Rx / DC Orders   ED Discharge Orders          Ordered    ondansetron (ZOFRAN-ODT) 4 MG disintegrating tablet  Every 8 hours PRN        07/01/23 1510    famotidine (PEPCID) 20 MG tablet  2 times daily        07/01/23 1510             Note:  This document was prepared using Dragon  voice recognition software and may include unintentional dictation errors.   Sharman Cheek, MD 07/01/23 [phone]

## 2023-07-01 NOTE — ED Notes (Signed)
RN notified provider about local site reaction to morphine that includes redness and rash. Pt denies any SOB or show any other signs on an allergic reaction or rash.

## 2023-07-01 NOTE — ED Provider Triage Note (Signed)
Emergency Medicine Provider Triage Evaluation Note  BYARD DUSTON , a 27 y.o. male  was evaluated in triage.  Pt complains of chest pain. He was started on Augmentin for otitis media, only took 3 days of the medication. He also reports stomach pain on going for a week, associates it with starting the antibiotic.  Review of Systems  Positive: Chest pain, diarrhea Negative: Nausea,  Physical Exam  There were no vitals taken for this visit. Gen:   Awake, no distress   Resp:  Normal effort  MSK:   Moves extremities without difficulty  Other:    Medical Decision Making  Medically screening exam initiated at 7:12 AM.  Appropriate orders placed.  GRANGER PALAZZOLO was informed that the remainder of the evaluation will be completed by another provider, this initial triage assessment does not replace that evaluation, and the importance of remaining in the ED until their evaluation is complete.     Cameron Ali, PA-C 07/01/23 0715

## 2023-07-01 NOTE — ED Notes (Signed)
Called x 1 for EKG/triage

## 2023-07-01 NOTE — ED Triage Notes (Signed)
Patient to ED via POV for left sided chest pain. States ongoing x1 week after started an antibiotic for congestion- only took 3 days of the antibiotics. States he has been vomiting and hurts his stomach to eat. Intermittent diarrhea but denies nausea.

## 2023-07-11 NOTE — Progress Notes (Deleted)
New patient visit  Patient: Bernard Benton   DOB: Nov 19, 1995   27 y.o. Male  MRN: [id number] Visit Date: 07/18/2023  Today's healthcare provider: Debera Lat, PA-C   No chief complaint on file.  Subjective    Bernard Benton is a 27 y.o. male who presents today as a new patient to establish care.  HPI  *** Discussed the use of AI scribe software for clinical note transcription with the patient, who gave verbal consent to proceed.  History of Present Illness            Past Medical History:  Diagnosis Date   Acid reflux    Depressed    Past Surgical History:  Procedure Laterality Date   HIP SURGERY     No family status information on file.   No family history on file. Social History   Socioeconomic History   Marital status: Married    Spouse name: Not on file   Number of children: Not on file   Years of education: Not on file   Highest education level: Not on file  Occupational History   Not on file  Tobacco Use   Smoking status: Former   Smokeless tobacco: Never  Vaping Use   Vaping status: Every Day  Substance and Sexual Activity   Alcohol use: Not Currently    Comment: occassionally   Drug use: Yes    Types: Marijuana    Comment: occassional   Sexual activity: Not on file  Other Topics Concern   Not on file  Social History Narrative   Not on file   Social Drivers of Health   Financial Resource Strain: Not on file  Food Insecurity: No Food Insecurity (05/15/2023)   Hunger Vital Sign    Worried About Running Out of Food in the Last Year: Never true    Ran Out of Food in the Last Year: Never true  Transportation Needs: No Transportation Needs (05/15/2023)   PRAPARE - Administrator, Civil Service (Medical): No    Lack of Transportation (Non-Medical): No  Physical Activity: Not on file  Stress: Not on file  Social Connections: Not on file   Outpatient Medications Prior to Visit  Medication Sig   amoxicillin-clavulanate  (AUGMENTIN) 875-125 MG tablet Take 1 tablet by mouth every 12 (twelve) hours.   azithromycin (ZITHROMAX) 250 MG tablet Take 1 tablet (250 mg total) by mouth daily. Take first 2 tablets together, then 1 every day until finished.   benzonatate (TESSALON) 100 MG capsule Take 1 capsule (100 mg total) by mouth every 8 (eight) hours.   famotidine (PEPCID) 20 MG tablet Take 1 tablet (20 mg total) by mouth 2 (two) times daily.   hydrOXYzine (ATARAX) 25 MG tablet Take 1 tablet (25 mg total) by mouth 3 (three) times daily as needed for anxiety.   lithium carbonate (LITHOBID) 300 MG ER tablet Take 1 tablet (300 mg total) by mouth every 12 (twelve) hours.   nicotine polacrilex (NICORETTE) 4 MG gum Take 1 each (4 mg total) by mouth as needed for smoking cessation.   ondansetron (ZOFRAN-ODT) 8 MG disintegrating tablet Take 1 tablet (8 mg total) by mouth every 8 (eight) hours as needed for nausea or vomiting.   predniSONE (STERAPRED UNI-PAK 21 TAB) 10 MG (21) TBPK tablet Take by mouth daily. Take 6 tabs by mouth daily  for 1 days, then 5 tabs for 1 days, then 4 tabs for 1 days, then 3 tabs for 1 days,  2 tabs for 1 days, then 1 tab by mouth daily for 1 days   promethazine-dextromethorphan (PROMETHAZINE-DM) 6.25-15 MG/5ML syrup Take 5 mLs by mouth 4 (four) times daily as needed for cough.   sucralfate (CARAFATE) 1 g tablet Take 1 tablet (1 g total) by mouth 4 (four) times daily.   traZODone (DESYREL) 50 MG tablet Take 1 tablet (50 mg total) by mouth at bedtime as needed for sleep.   No facility-administered medications prior to visit.   Allergies  Allergen Reactions   Other     Pt reports allergic to an IV ABX but does not remember which one     There is no immunization history on file for this patient.  Health Maintenance  Topic Date Due   HIV Screening  Never done   Hepatitis C Screening  Never done   DTaP/Tdap/Td (1 - Tdap) Never done   INFLUENZA VACCINE  Never done   COVID-19 Vaccine (1 - 2024-25  season) Never done   HPV VACCINES  Aged Out    Patient Care Team: Pcp, No as PCP - General  Review of Systems  All other systems reviewed and are negative.  Except see HPI   {Insert previous labs (optional):23779} {See past labs  Heme  Chem  Endocrine  Serology  Results Review (optional):1}   Objective    There were no vitals taken for this visit. {Insert last BP/Wt (optional):23777}{See vitals history (optional):1}   Physical Exam Vitals reviewed.  Constitutional:      General: He is not in acute distress.    Appearance: Normal appearance. He is not diaphoretic.  HENT:     Head: Normocephalic and atraumatic.  Eyes:     General: No scleral icterus.    Conjunctiva/sclera: Conjunctivae normal.  Cardiovascular:     Rate and Rhythm: Normal rate and regular rhythm.     Pulses: Normal pulses.     Heart sounds: Normal heart sounds. No murmur heard. Pulmonary:     Effort: Pulmonary effort is normal. No respiratory distress.     Breath sounds: Normal breath sounds. No wheezing or rhonchi.  Musculoskeletal:     Cervical back: Neck supple.     Right lower leg: No edema.     Left lower leg: No edema.  Lymphadenopathy:     Cervical: No cervical adenopathy.  Skin:    General: Skin is warm and dry.     Findings: No rash.  Neurological:     Mental Status: He is alert and oriented to person, place, and time. Mental status is at baseline.  Psychiatric:        Mood and Affect: Mood normal.        Behavior: Behavior normal.     Depression Screen     No data to display         No results found for any visits on 07/18/23.  Assessment & Plan        Encounter to establish care Welcomed to our clinic Reviewed past medical hx, social hx, family hx and surgical hx Pt advised to send all vaccination records or screening   No follow-ups on file.    The patient was advised to call back or seek an in-person evaluation if the symptoms worsen or if the condition fails  to improve as anticipated.  I discussed the assessment and treatment plan with the patient. The patient was provided an opportunity to ask questions and all were answered. The patient agreed with the plan and  demonstrated an understanding of the instructions.  I, Debera Lat, PA-C have reviewed all documentation for this visit. The documentation on  07/18/2023   for the exam, diagnosis, procedures, and orders are all accurate and complete.  Debera Lat, [facility], MMS [facility] [phone] (phone) [phone] (fax)  [facility]

## 2023-07-18 ENCOUNTER — Ambulatory Visit: Payer: Self-pay | Admitting: Physician Assistant

## 2023-07-18 DIAGNOSIS — F39 Unspecified mood [affective] disorder: Secondary | ICD-10-CM

## 2023-07-18 DIAGNOSIS — R45851 Suicidal ideations: Secondary | ICD-10-CM

## 2023-07-18 DIAGNOSIS — Z7689 Persons encountering health services in other specified circumstances: Secondary | ICD-10-CM

## 2023-07-18 DIAGNOSIS — F4310 Post-traumatic stress disorder, unspecified: Secondary | ICD-10-CM

## 2023-07-20 ENCOUNTER — Other Ambulatory Visit: Payer: Self-pay

## 2023-07-20 ENCOUNTER — Encounter (HOSPITAL_COMMUNITY): Payer: Self-pay

## 2023-07-20 ENCOUNTER — Emergency Department (HOSPITAL_COMMUNITY): Payer: BC Managed Care – PPO

## 2023-07-20 ENCOUNTER — Inpatient Hospital Stay (HOSPITAL_COMMUNITY)
Admission: EM | Admit: 2023-07-20 | Discharge: 2023-07-22 | DRG: 379 | Disposition: A | Discharge status: Home / Self Care | Payer: BC Managed Care – PPO | Attending: Internal Medicine | Admitting: Internal Medicine

## 2023-07-20 DIAGNOSIS — K449 Diaphragmatic hernia without obstruction or gangrene: Secondary | ICD-10-CM | POA: Diagnosis present

## 2023-07-20 DIAGNOSIS — F419 Anxiety disorder, unspecified: Secondary | ICD-10-CM | POA: Diagnosis present

## 2023-07-20 DIAGNOSIS — E66811 Obesity, class 1: Secondary | ICD-10-CM | POA: Insufficient documentation

## 2023-07-20 DIAGNOSIS — K922 Gastrointestinal hemorrhage, unspecified: Secondary | ICD-10-CM

## 2023-07-20 DIAGNOSIS — K264 Chronic or unspecified duodenal ulcer with hemorrhage: Secondary | ICD-10-CM | POA: Diagnosis not present

## 2023-07-20 DIAGNOSIS — F39 Unspecified mood [affective] disorder: Secondary | ICD-10-CM | POA: Diagnosis not present

## 2023-07-20 DIAGNOSIS — K2100 Gastro-esophageal reflux disease with esophagitis, without bleeding: Secondary | ICD-10-CM | POA: Insufficient documentation

## 2023-07-20 DIAGNOSIS — Z888 Allergy status to other drugs, medicaments and biological substances status: Secondary | ICD-10-CM

## 2023-07-20 DIAGNOSIS — K2980 Duodenitis without bleeding: Principal | ICD-10-CM

## 2023-07-20 DIAGNOSIS — Z885 Allergy status to narcotic agent status: Secondary | ICD-10-CM

## 2023-07-20 DIAGNOSIS — K2970 Gastritis, unspecified, without bleeding: Secondary | ICD-10-CM

## 2023-07-20 DIAGNOSIS — K269 Duodenal ulcer, unspecified as acute or chronic, without hemorrhage or perforation: Secondary | ICD-10-CM | POA: Diagnosis present

## 2023-07-20 DIAGNOSIS — Z79899 Other long term (current) drug therapy: Secondary | ICD-10-CM

## 2023-07-20 DIAGNOSIS — F17290 Nicotine dependence, other tobacco product, uncomplicated: Secondary | ICD-10-CM | POA: Diagnosis present

## 2023-07-20 DIAGNOSIS — Z6831 Body mass index (BMI) 31.0-31.9, adult: Secondary | ICD-10-CM

## 2023-07-20 LAB — CBC WITH DIFFERENTIAL/PLATELET
Abs Immature Granulocytes: 0.06 10*3/uL (ref 0.00–0.07)
Basophils Absolute: 0.1 10*3/uL (ref 0.0–0.1)
Basophils Relative: 1 %
Eosinophils Absolute: 0.2 10*3/uL (ref 0.0–0.5)
Eosinophils Relative: 1 %
HCT: 42.2 % (ref 39.0–52.0)
Hemoglobin: 14.5 g/dL (ref 13.0–17.0)
Immature Granulocytes: 0 %
Lymphocytes Relative: 23 %
Lymphs Abs: 3.2 10*3/uL (ref 0.7–4.0)
MCH: 30.9 pg (ref 26.0–34.0)
MCHC: 34.4 g/dL (ref 30.0–36.0)
MCV: 89.8 fL (ref 80.0–100.0)
Monocytes Absolute: 0.8 10*3/uL (ref 0.1–1.0)
Monocytes Relative: 6 %
Neutro Abs: 9.3 10*3/uL — ABNORMAL HIGH (ref 1.7–7.7)
Neutrophils Relative %: 69 %
Platelets: 340 10*3/uL (ref 150–400)
RBC: 4.7 MIL/uL (ref 4.22–5.81)
RDW: 12 % (ref 11.5–15.5)
WBC: 13.6 10*3/uL — ABNORMAL HIGH (ref 4.0–10.5)
nRBC: 0 % (ref 0.0–0.2)

## 2023-07-20 LAB — COMPREHENSIVE METABOLIC PANEL
ALT: 75 U/L — ABNORMAL HIGH (ref 0–44)
AST: 59 U/L — ABNORMAL HIGH (ref 15–41)
Albumin: 4.4 g/dL (ref 3.5–5.0)
Alkaline Phosphatase: 59 U/L (ref 38–126)
Anion gap: 14 (ref 5–15)
BUN: 10 mg/dL (ref 6–20)
CO2: 20 mmol/L — ABNORMAL LOW (ref 22–32)
Calcium: 10 mg/dL (ref 8.9–10.3)
Chloride: 103 mmol/L (ref 98–111)
Creatinine, Ser: 0.86 mg/dL (ref 0.61–1.24)
GFR, Estimated: >60 mL/min (ref >60)
Glucose, Bld: 87 mg/dL (ref 70–99)
Potassium: 4.8 mmol/L (ref 3.5–5.1)
Sodium: 137 mmol/L (ref 135–145)
Total Bilirubin: 1.3 mg/dL — ABNORMAL HIGH (ref 0.0–1.2)
Total Protein: 8.1 g/dL (ref 6.5–8.1)

## 2023-07-20 LAB — TYPE AND SCREEN
ABO/RH(D): A POS
Antibody Screen: NEGATIVE

## 2023-07-20 LAB — POC OCCULT BLOOD, ED: Fecal Occult Bld: NEGATIVE

## 2023-07-20 LAB — LACTIC ACID, PLASMA: Lactic Acid, Venous: 1.1 mmol/L (ref 0.5–1.9)

## 2023-07-20 LAB — LIPASE, BLOOD: Lipase: 27 U/L (ref 11–51)

## 2023-07-20 LAB — PROTIME-INR
INR: 1 (ref 0.8–1.2)
Prothrombin Time: 13.4 s (ref 11.4–15.2)

## 2023-07-20 MED ORDER — PANTOPRAZOLE SODIUM 40 MG IV SOLR
40.0000 mg | Freq: Once | INTRAVENOUS | Status: AC
  Administered 2023-07-20: 40 mg via INTRAVENOUS
  Filled 2023-07-20: qty 10

## 2023-07-20 MED ORDER — HYDROMORPHONE HCL 1 MG/ML IJ SOLN
0.5000 mg | Freq: Once | INTRAMUSCULAR | Status: AC
  Administered 2023-07-20: 0.5 mg via INTRAVENOUS
  Filled 2023-07-20: qty 1

## 2023-07-20 MED ORDER — HYDROMORPHONE HCL 1 MG/ML IJ SOLN
0.5000 mg | INTRAMUSCULAR | Status: DC | PRN
  Administered 2023-07-20 – 2023-07-22 (×6): 1 mg via INTRAVENOUS
  Administered 2023-07-22: 0.5 mg via INTRAVENOUS
  Administered 2023-07-22: 1 mg via INTRAVENOUS
  Filled 2023-07-20 (×8): qty 1

## 2023-07-20 MED ORDER — LACTATED RINGERS IV SOLN: INTRAVENOUS | Status: DC

## 2023-07-20 MED ORDER — PIPERACILLIN-TAZOBACTAM 3.375 G IVPB
3.3750 g | Freq: Three times a day (TID) | INTRAVENOUS | Status: DC
  Administered 2023-07-20 – 2023-07-21 (×3): 3.375 g via INTRAVENOUS
  Filled 2023-07-20 (×3): qty 50

## 2023-07-20 MED ORDER — PANTOPRAZOLE SODIUM 40 MG IV SOLR
40.0000 mg | Freq: Two times a day (BID) | INTRAVENOUS | Status: DC
  Administered 2023-07-21 – 2023-07-22 (×3): 40 mg via INTRAVENOUS
  Filled 2023-07-20 (×3): qty 10

## 2023-07-20 MED ORDER — ONDANSETRON HCL 4 MG/2ML IJ SOLN
4.0000 mg | Freq: Four times a day (QID) | INTRAMUSCULAR | Status: DC | PRN
  Administered 2023-07-21: 4 mg via INTRAVENOUS
  Filled 2023-07-20: qty 2

## 2023-07-20 MED ORDER — ACETAMINOPHEN 650 MG RE SUPP
650.0000 mg | Freq: Four times a day (QID) | RECTAL | Status: DC | PRN
Start: 2023-07-20 — End: 2023-07-22

## 2023-07-20 MED ORDER — HYDROMORPHONE HCL 1 MG/ML IJ SOLN
1.0000 mg | Freq: Once | INTRAMUSCULAR | Status: AC
  Administered 2023-07-20: 1 mg via INTRAVENOUS
  Filled 2023-07-20: qty 1

## 2023-07-20 MED ORDER — ACETAMINOPHEN 325 MG PO TABS: 650.0000 mg | ORAL_TABLET | Freq: Four times a day (QID) | ORAL | Status: DC | PRN

## 2023-07-20 MED ORDER — ONDANSETRON HCL 4 MG PO TABS: 4.0000 mg | ORAL_TABLET | Freq: Four times a day (QID) | ORAL | Status: DC | PRN

## 2023-07-20 MED ORDER — ONDANSETRON HCL 4 MG/2ML IJ SOLN
4.0000 mg | Freq: Once | INTRAMUSCULAR | Status: AC
Start: 2023-07-20 — End: 2023-07-20
  Administered 2023-07-20: 4 mg via INTRAVENOUS
  Filled 2023-07-20: qty 2

## 2023-07-20 MED ORDER — PANTOPRAZOLE SODIUM 40 MG IV SOLR: 80.0000 mg | Freq: Two times a day (BID) | INTRAVENOUS | Status: DC

## 2023-07-20 MED ORDER — SUCRALFATE 1 G PO TABS
1.0000 g | ORAL_TABLET | Freq: Four times a day (QID) | ORAL | Status: DC
  Administered 2023-07-20 – 2023-07-21 (×3): 1 g via ORAL
  Filled 2023-07-20 (×3): qty 1

## 2023-07-20 MED ORDER — IOHEXOL 350 MG/ML SOLN
75.0000 mL | Freq: Once | INTRAVENOUS | Status: AC | PRN
  Administered 2023-07-20: 75 mL via INTRAVENOUS

## 2023-07-20 NOTE — Assessment & Plan Note (Deleted)
Med rec pending ?

## 2023-07-20 NOTE — ED Notes (Signed)
 ED TO INPATIENT HANDOFF REPORT  ED Nurse Name and Phone #: Jerona [phone]  S Name/Age/Gender Bernard Benton 28 y.o. male Room/Bed: 025C/025C  Code Status   Code Status: Full Code  Home/SNF/Other Home Patient oriented to: self, place, time, and situation Is this baseline? Yes   Triage Complete: Triage complete  Chief Complaint Duodenal ulcer [K26.9]  Triage Note Pt reports recent diagnosis of duodenitis and has been taking ibuprofen and tylenol  for pain. Pt reports waves of abd pain and dark, coffee ground stools, and acid reflux.    Allergies Allergies  Allergen Reactions   Other     Pt reports allergic to an IV ABX but does not remember which one   Morphine  Rash    Local rash     Level of Care/Admitting Diagnosis ED Disposition     ED Disposition  Admit   Condition  --   Comment  Hospital Area: [facility] [[id number]]  Level of Care: Med-Surg [16]  May place patient in observation at [facility] or [facility] if equivalent level of care is available:: No  Covid Evaluation: Asymptomatic - no recent exposure (last 10 days) testing not required  Diagnosis: Duodenal ulcer [258450]  Admitting Physician: LONZELL EMELINE HERO [[id number]]  Attending Physician: LONZELL EMELINE HERO [[id number]]          B Medical/Surgery History Past Medical History:  Diagnosis Date   Acid reflux    Depressed    Past Surgical History:  Procedure Laterality Date   HIP SURGERY       A IV Location/Drains/Wounds Patient Lines/Drains/Airways Status     Active Line/Drains/Airways     Name Placement date Placement time Site Days   Peripheral IV 07/20/23 20 G 1 Left Antecubital 07/20/23  1654  Antecubital  less than 1            Intake/Output Last 24 hours No intake or output data in the 24 hours ending 07/20/23 2323  Labs/Imaging Results for orders placed or performed during the hospital encounter of 07/20/23 (from the past 48 hours)  Comprehensive metabolic panel      Status: Abnormal   Collection Time: 07/20/23  4:45 PM  Result Value Ref Range   Sodium 137 135 - 145 mmol/L   Potassium 4.8 3.5 - 5.1 mmol/L    Comment: HEMOLYSIS AT THIS LEVEL MAY AFFECT RESULT   Chloride 103 98 - 111 mmol/L   CO2 20 (L) 22 - 32 mmol/L   Glucose, Bld 87 70 - 99 mg/dL    Comment: Glucose reference range applies only to samples taken after fasting for at least 8 hours.   BUN 10 6 - 20 mg/dL   Creatinine, Ser 9.13 0.61 - 1.24 mg/dL   Calcium 89.9 8.9 - 89.6 mg/dL   Total Protein 8.1 6.5 - 8.1 g/dL   Albumin 4.4 3.5 - 5.0 g/dL   AST 59 (H) 15 - 41 U/L    Comment: HEMOLYSIS AT THIS LEVEL MAY AFFECT RESULT   ALT 75 (H) 0 - 44 U/L    Comment: HEMOLYSIS AT THIS LEVEL MAY AFFECT RESULT   Alkaline Phosphatase 59 38 - 126 U/L   Total Bilirubin 1.3 (H) 0.0 - 1.2 mg/dL    Comment: HEMOLYSIS AT THIS LEVEL MAY AFFECT RESULT   GFR, Estimated >60 >60 mL/min    Comment: (NOTE) Calculated using the CKD-EPI Creatinine Equation (2021)    Anion gap 14 5 - 15    Comment: Performed at [facility], [address], [place], [place] [address]  Lipase, blood     Status: None   Collection Time: 07/20/23  4:45 PM  Result Value Ref Range   Lipase 27 11 - 51 U/L    Comment: HEMOLYSIS AT THIS LEVEL MAY AFFECT RESULT Performed at [facility], [address] [place], [place] [address]   Type and screen [facility]     Status: None   Collection Time: 07/20/23  8:11 PM  Result Value Ref Range   ABO/RH(D) A POS    Antibody Screen NEG    Sample Expiration      07/23/2023,2359 Performed at [facility], [address], [place], [place] [address]   CBC with Differential/Platelet     Status: Abnormal   Collection Time: 07/20/23  8:14 PM  Result Value Ref Range   WBC 13.6 (H) 4.0 - 10.5 K/uL   RBC 4.70 4.22 - 5.81 MIL/uL   Hemoglobin 14.5 13.0 - 17.0 g/dL   HCT 57.7 60.9 - 47.9 %   MCV 89.8 80.0 - 100.0 fL   MCH 30.9 26.0 - 34.0 pg   MCHC 34.4 30.0  - 36.0 g/dL   RDW 87.9 88.4 - 84.4 %   Platelets 340 150 - 400 K/uL   nRBC 0.0 0.0 - 0.2 %   Neutrophils Relative % 69 %   Neutro Abs 9.3 (H) 1.7 - 7.7 K/uL   Lymphocytes Relative 23 %   Lymphs Abs 3.2 0.7 - 4.0 K/uL   Monocytes Relative 6 %   Monocytes Absolute 0.8 0.1 - 1.0 K/uL   Eosinophils Relative 1 %   Eosinophils Absolute 0.2 0.0 - 0.5 K/uL   Basophils Relative 1 %   Basophils Absolute 0.1 0.0 - 0.1 K/uL   Immature Granulocytes 0 %   Abs Immature Granulocytes 0.06 0.00 - 0.07 K/uL    Comment: Performed at [facility], [address] [place], [place] [address]  Protime-INR     Status: None   Collection Time: 07/20/23  8:14 PM  Result Value Ref Range   Prothrombin Time 13.4 11.4 - 15.2 seconds   INR 1.0 0.8 - 1.2    Comment: (NOTE) INR goal varies based on device and disease states. Performed at [facility], [address] [place], [place] [address]   Lactic acid, plasma     Status: None   Collection Time: 07/20/23  8:14 PM  Result Value Ref Range   Lactic Acid, Venous 1.1 0.5 - 1.9 mmol/L    Comment: Performed at [facility], [address], [place], [place] [address]  ABO/Rh     Status: None   Collection Time: 07/20/23  8:20 PM  Result Value Ref Range   ABO/RH(D)      A POS Performed at [facility], [address] [place], [place] [address]   POC occult blood, ED     Status: None   Collection Time: 07/20/23  8:49 PM  Result Value Ref Range   Fecal Occult Bld NEGATIVE NEGATIVE   CT ABDOMEN PELVIS W CONTRAST Result Date: 07/20/2023 CLINICAL DATA:  Abdominal pain. Recent diagnosis of duodenitis. Abdominal pain and dark coffee-ground stools and acid reflux EXAM: CT ABDOMEN AND PELVIS WITH CONTRAST TECHNIQUE: Multidetector CT imaging of the abdomen and pelvis was performed using the standard protocol following bolus administration of intravenous contrast. RADIATION DOSE REDUCTION: This exam was performed according to the  departmental  dose-optimization program which includes automated exposure control, adjustment of the mA and/or kV according to patient size and/or use of iterative reconstruction technique. CONTRAST:  75mL OMNIPAQUE  IOHEXOL  350 MG/ML SOLN COMPARISON:  07/01/2023 FINDINGS: Lower chest: No acute abnormality. Hepatobiliary: Unremarkable liver. Normal gallbladder. No biliary dilation. Pancreas: Unremarkable. Spleen: Unremarkable. Adrenals/Urinary Tract: Normal adrenal glands. No urinary calculi or hydronephrosis. Bladder is unremarkable. Stomach/Bowel: Stomach is within normal limits. Mild wall thickening and adjacent stranding about the proximal duodenum. There is a new outpouching of fluid along the medial wall of the duodenum measuring 1.6 x 1.6 cm (series 3/image 28 and 6/95). This is compatible with a duodenal ulcer. No free intraperitoneal air to suggest perforation. Vascular/Lymphatic: Normal caliber aorta. 8 mm lymph node inferior to the inflamed duodenum is similar to prior (series 6/image 101). Reproductive: Unremarkable. Other: No abdominal wall hernia. Musculoskeletal: No acute fracture. IMPRESSION: Acute duodenitis with new 1.6 cm duodenal ulcer. No free intraperitoneal air to suggest perforation. Electronically Signed   By: Norman Gatlin M.D.   On: 07/20/2023 19:37    Pending Labs Unresulted Labs (From admission, onward)     Start     Ordered   07/21/23 0500  CBC  Tomorrow morning,   R        07/20/23 2237   07/21/23 0500  Basic metabolic panel  Tomorrow morning,   R        07/20/23 2237   07/20/23 2237  HIV Antibody (routine testing w rflx)  (HIV Antibody (Routine testing w reflex) panel)  Once,   R        07/20/23 2237   07/20/23 1940  Lactic acid, plasma  (Lactic Acid)  Now then every 2 hours,   R (with STAT occurrences)      07/20/23 1939   07/20/23 1631  CBC with Differential  Once,   STAT        07/20/23 1631   07/20/23 1631  Urinalysis, Routine w reflex microscopic -Urine, Clean Catch  Once,    URGENT       Question:  Specimen Source  Answer:  Urine, Clean Catch   07/20/23 1631            Vitals/Pain Today's Vitals   07/20/23 1724 07/20/23 1918 07/20/23 2035 07/20/23 2233  BP:  (!) 135/92  132/85  Pulse:  (!) 54  (!) 46  Resp:  17  12  Temp:  (!) 97.4 F (36.3 C)  98.1 F (36.7 C)  TempSrc:  Oral  Oral  SpO2:  99%  100%  Weight:      Height:      PainSc: 7   2      Isolation Precautions No active isolations  Medications Medications  pantoprazole  (PROTONIX ) injection 80 mg (has no administration in time range)  lactated ringers  infusion (has no administration in time range)  sucralfate  (CARAFATE ) tablet 1 g (has no administration in time range)  piperacillin -tazobactam (ZOSYN ) IVPB 3.375 g (3.375 g Intravenous New Bag/Given 07/20/23 2322)  acetaminophen  (TYLENOL ) tablet 650 mg (has no administration in time range)    Or  acetaminophen  (TYLENOL ) suppository 650 mg (has no administration in time range)  ondansetron  (ZOFRAN ) tablet 4 mg (has no administration in time range)    Or  ondansetron  (ZOFRAN ) injection 4 mg (has no administration in time range)  HYDROmorphone  (DILAUDID ) injection 0.5 mg (0.5 mg Intravenous Given 07/20/23 1654)  ondansetron  (ZOFRAN ) injection 4 mg (4 mg Intravenous Given 07/20/23 1654)  iohexol  (  OMNIPAQUE ) 350 MG/ML injection 75 mL (75 mLs Intravenous Contrast Given 07/20/23 1855)  HYDROmorphone  (DILAUDID ) injection 1 mg (1 mg Intravenous Given 07/20/23 1956)  pantoprazole  (PROTONIX ) injection 40 mg (40 mg Intravenous Given 07/20/23 1956)    Mobility walks     Focused Assessments    R Recommendations: See Admitting Provider Note  Report given to:   Additional Notes: Call or message with any questions.

## 2023-07-20 NOTE — ED Triage Notes (Addendum)
 Pt reports recent diagnosis of duodenitis and has been taking ibuprofen and tylenol for pain. Pt reports waves of abd pain and dark, coffee ground stools, and acid reflux.

## 2023-07-20 NOTE — Assessment & Plan Note (Addendum)
 On Admission, Duodenal ulcer, not yet perforated but with outpouching and looks like its pending perf.  New / progressed since duodenitis diagnosis in ED at [facility] 07/01/23.  At risk for perforation. May or may not have H. Pylori, looks like someone had him on empiric treatment as outpt for H.Pylori these past couple of weeks. IV PPI at maximum labeled dose NPO except sips with carafate  Carafate  PO Q6H Spoke with Dr. Stevie (gen surg): Add empiric ABx: Zosyn  Call GI in AM, though not sure if they will or wont want to do EGD given the outpouching and near perforation appearance of the ulcer on CT scan. Despite ulcer presence: hemoccult negative and no anemia (HGB 14.5), doesn't seem to have significant GIB with this Dilaudid  prn pain Zofran  prn nausea Advised pt: no more NSAIDS  07-21-2023 GI and general surgery consulted. GI plans on EGD tomorrow. Continue with protonix  IV bid. NPO after MN. I see no need for IV abx at this point.  07-22-2023 EGD today shows Grade C esophagitis and non-bleeding duodenal ulcer. DC to home with 6 weeks of bid protonix  40 mg and 2 weeks of QID carafate  1 gram

## 2023-07-20 NOTE — ED Provider Triage Note (Signed)
 Emergency Medicine Provider Triage Evaluation Note  Bernard Benton , a 28 y.o. male  was evaluated in triage.  Pt complains of abdominal pain, tarry stools.  Review of Systems  Positive: Tarry stools, left upper quadrant abdominal pain, emesis, nausea, chills Negative: Chest pain, shortness of breath, fever, headache cough  Physical Exam  BP (!) 164/115 (BP Location: Right Arm)   Pulse 81   Temp 97.8 F (36.6 C)   Resp (!) 28   Ht 6' 2 (1.88 m)   Wt 111.1 kg   SpO2 100%   BMI 31.46 kg/m  Gen:   Awake, mild distress Resp:  Normal effort  MSK:   Moves extremities without difficulty  Other:  Slightly pale  Medical Decision Making  Medically screening exam initiated at 4:29 PM.  Appropriate orders placed.  Bernard Benton was informed that the remainder of the evaluation will be completed by another provider, this initial triage assessment does not replace that evaluation, and the importance of remaining in the ED until their evaluation is complete.  Labs and imaging ordered   Bernard Benton 07/20/23 [id number]

## 2023-07-20 NOTE — ED Notes (Signed)
 Dr. Jearld Fenton at bedside to perform hemoccult.

## 2023-07-20 NOTE — Assessment & Plan Note (Addendum)
 Stable. Only on prn hydroxyzine for anxiety.

## 2023-07-20 NOTE — Progress Notes (Signed)
 Pharmacy Antibiotic Note  Bernard Benton is a 28 y.o. male admitted on 07/20/2023 with concern for intra-abdominal infection. CT abd/pelv with acute duodenitis with no perforation noted. Pharmacy has been consulted for zosyn  dosing.  Plan: Zosyn  3.375g q8h  F/u renal function, infectious work up and narrow as able   Height: 6' 2 (188 cm) Weight: 111.1 kg (245 lb) IBW/kg (Calculated) : 82.2  Temp (24hrs), Avg:97.6 F (36.4 C), Min:97.4 F (36.3 C), Max:97.8 F (36.6 C)  Recent Labs  Lab 07/20/23 1645 07/20/23 2014  WBC  --  13.6*  CREATININE 0.86  --   LATICACIDVEN  --  1.1    Estimated Creatinine Clearance: 169.7 mL/min (by C-G formula based on SCr of 0.86 mg/dL).    Allergies  Allergen Reactions   Other     Pt reports allergic to an IV ABX but does not remember which one   Morphine  Rash    Local rash     Antimicrobials this admission: Zosyn  1/4 >  Thank you for allowing pharmacy to be a part of this patient's care.  Leonor GORMAN Bash 07/20/2023 10:20 PM

## 2023-07-20 NOTE — ED Provider Notes (Signed)
 [place] EMERGENCY DEPARTMENT AT [facility] Provider Note   CSN: [id number] Arrival date & time: 07/20/23  1604     History  Chief Complaint  Patient presents with   Abdominal Pain    Bernard Benton is a 28 y.o. male with PMH as listed below who presents with recent diagnosis of duodenitis a couple of weeks ago via CT scan in the ED at [facility] and has been taking ibuprofen and tylenol  for pain. Pt reports waves of worsening and intense abd pain and dark, coffee ground stools, which started a couple of days ago. Reporting 10-15 stools per day. Was placed on liquid diet for a few days and had improved N/V but hasn't been able to eat/drink much since then d/t pain.  Past Medical History:  Diagnosis Date   Acid reflux    Depressed    Suicidal ideation 05/15/2023       Home Medications Prior to Admission medications   Medication Sig Start Date End Date Taking? Authorizing Provider  nicotine  polacrilex (NICORETTE ) 4 MG gum Take 1 each (4 mg total) by mouth as needed for smoking cessation. 05/20/23  Yes Victoria Ruts, MD  hydrOXYzine  (ATARAX ) 25 MG tablet Take 1 tablet (25 mg total) by mouth 3 (three) times daily as needed for anxiety. 05/20/23   Victoria Ruts, MD      Allergies    Other and Morphine     Review of Systems   Review of Systems A 10 point review of systems was performed and is negative unless otherwise reported in HPI.  Physical Exam Updated Vital Signs BP (!) 152/98 (BP Location: Left Arm)   Pulse (!) 54   Temp 97.8 F (36.6 C) (Oral)   Resp 18   Ht 6' 2 (1.88 m)   Wt 111.1 kg   SpO2 99%   BMI 31.46 kg/m  Physical Exam General: Uncomfortable appearing male, lying in bed.  HEENT: Sclera anicteric, MMM, trachea midline.  Cardiology: RRR, no murmurs/rubs/gallops.  Resp: Normal respiratory rate and effort. CTAB, no wheezes, rhonchi, crackles.  Abd: Very tender to palpation in the epigastric and left upper quadrant regions, also diffusely  but less so. soft, non-distended. No rebound tenderness or guarding.  Rectal: Performed with nurse chaperone.  Unremarkable appearing external anal sphincter.  No masses, induration, or tenderness to palpation of the rectum.  Dark stool noted on glove.  No bright red blood. MSK: No peripheral edema or signs of trauma. Skin: warm, dry.  Neuro: A&Ox4, CNs II-XII grossly intact. MAEs. Sensation grossly intact.  Psych: Normal mood and affect.   ED Results / Procedures / Treatments   Labs (all labs ordered are listed, but only abnormal results are displayed) Labs Reviewed  COMPREHENSIVE METABOLIC PANEL - Abnormal; Notable for the following components:      Result Value   CO2 20 (*)    AST 59 (*)    ALT 75 (*)    Total Bilirubin 1.3 (*)    All other components within normal limits  CBC WITH DIFFERENTIAL/PLATELET - Abnormal; Notable for the following components:   WBC 13.6 (*)    Neutro Abs 9.3 (*)    All other components within normal limits  LIPASE, BLOOD  PROTIME-INR  LACTIC ACID, PLASMA  LACTIC ACID, PLASMA  HIV ANTIBODY (ROUTINE TESTING W REFLEX)  CBC  BASIC METABOLIC PANEL  IRON AND TIBC  CBC WITH DIFFERENTIAL/PLATELET  URINALYSIS, ROUTINE W REFLEX MICROSCOPIC  POC OCCULT BLOOD, ED  TYPE AND SCREEN  ABO/RH    EKG None  Radiology CT ABDOMEN PELVIS W CONTRAST Result Date: 07/20/2023 CLINICAL DATA:  Abdominal pain. Recent diagnosis of duodenitis. Abdominal pain and dark coffee-ground stools and acid reflux EXAM: CT ABDOMEN AND PELVIS WITH CONTRAST TECHNIQUE: Multidetector CT imaging of the abdomen and pelvis was performed using the standard protocol following bolus administration of intravenous contrast. RADIATION DOSE REDUCTION: This exam was performed according to the departmental dose-optimization program which includes automated exposure control, adjustment of the mA and/or kV according to patient size and/or use of iterative reconstruction technique. CONTRAST:  75mL  OMNIPAQUE  IOHEXOL  350 MG/ML SOLN COMPARISON:  07/01/2023 FINDINGS: Lower chest: No acute abnormality. Hepatobiliary: Unremarkable liver. Normal gallbladder. No biliary dilation. Pancreas: Unremarkable. Spleen: Unremarkable. Adrenals/Urinary Tract: Normal adrenal glands. No urinary calculi or hydronephrosis. Bladder is unremarkable. Stomach/Bowel: Stomach is within normal limits. Mild wall thickening and adjacent stranding about the proximal duodenum. There is a new outpouching of fluid along the medial wall of the duodenum measuring 1.6 x 1.6 cm (series 3/image 28 and 6/95). This is compatible with a duodenal ulcer. No free intraperitoneal air to suggest perforation. Vascular/Lymphatic: Normal caliber aorta. 8 mm lymph node inferior to the inflamed duodenum is similar to prior (series 6/image 101). Reproductive: Unremarkable. Other: No abdominal wall hernia. Musculoskeletal: No acute fracture. IMPRESSION: Acute duodenitis with new 1.6 cm duodenal ulcer. No free intraperitoneal air to suggest perforation. Electronically Signed   By: Norman Gatlin M.D.   On: 07/20/2023 19:37    Procedures Procedures    Medications Ordered in ED Medications  HYDROmorphone  (DILAUDID ) injection 0.5 mg (0.5 mg Intravenous Given 07/20/23 1654)  ondansetron  (ZOFRAN ) injection 4 mg (4 mg Intravenous Given 07/20/23 1654)  iohexol  (OMNIPAQUE ) 350 MG/ML injection 75 mL (75 mLs Intravenous Contrast Given 07/20/23 1855)  HYDROmorphone  (DILAUDID ) injection 1 mg (1 mg Intravenous Given 07/20/23 1956)  pantoprazole  (PROTONIX ) injection 40 mg (40 mg Intravenous Given 07/20/23 1956)    ED Course/ Medical Decision Making/ A&P                          Medical Decision Making Amount and/or Complexity of Data Reviewed Labs: ordered. Decision-making details documented in ED Course. Radiology:  Decision-making details documented in ED Course.  Risk Prescription drug management. Decision regarding hospitalization.    This patient  presents to the ED for concern of those listed above, this involves an extensive number of treatment options, and is a complaint that carries with it a high risk of complications and morbidity.  I considered the following differential and admission for this acute, potentially life threatening condition.   MDM:    For DDX for abdominal pain includes but is not limited to:  Abdominal exam without peritoneal signs. No evidence of acute abdomen at this time.   Consider worsening duodenitis, PUD/gastritis. Lower c/f viscous perforation without signs of acute abdomen. Also consider other causes of abd pain such as pancreatitis, acute hepatobiliary disease (including acute cholecystitis or cholangitis), acute appendicitis, vascular catastrophe, bowel obstruction. He does report black stool, consider possible UGIB and will test POC occult. Consider possible bleeding from known duodenitis/ulcer disease, NSAID use given report of recent ibuprofen use.  Clinical Course as of 07/21/23 1614  Sat Jul 20, 2023  1823 ALT(!): 75 [HN]  1824 AST(!): 59 Mildly elevated LFTs [HN]  1824 Lipase: 27 wnl [HN]  1944 CT ABDOMEN PELVIS W CONTRAST Acute duodenitis with new 1.6 cm duodenal ulcer. No free intraperitoneal air to  suggest perforation.   [HN]  2104 Fecal Occult Blood, POC: NEGATIVE Negative fecal occult [HN]  2105 Lactic Acid, Venous: 1.1 wnl [HN]  2106 WBC(!): 13.6 Mild leukocytosis [HN]  2106 Hemoglobin: 14.5 stable [HN]  2204 Admitted to Dr. Lonzell [HN]    Clinical Course User Index [HN] Franklyn Sid SAILOR, MD    Labs: I Ordered, and personally interpreted labs.  The pertinent results include:  those listed aobve  Imaging Studies ordered: CT abd pelvis w contrast ordered from triage I independently visualized and interpreted imaging. I agree with the radiologist interpretation  Additional history obtained from chart review.    Reevaluation: After the interventions noted above, I  reevaluated the patient and found that they have :improved  Social Determinants of Health: Lives independently  Disposition:  Admit to hospitalist  Co morbidities that complicate the patient evaluation  Past Medical History:  Diagnosis Date   Acid reflux    Depressed    Suicidal ideation 05/15/2023     Medicines Meds ordered this encounter  Medications   HYDROmorphone  (DILAUDID ) injection 0.5 mg   ondansetron  (ZOFRAN ) injection 4 mg   iohexol  (OMNIPAQUE ) 350 MG/ML injection 75 mL   HYDROmorphone  (DILAUDID ) injection 1 mg   pantoprazole  (PROTONIX ) injection 40 mg   DISCONTD: pantoprazole  (PROTONIX ) injection 80 mg   DISCONTD: lactated ringers  infusion   DISCONTD: sucralfate  (CARAFATE ) tablet 1 g   DISCONTD: piperacillin -tazobactam (ZOSYN ) IVPB 3.375 g    Antibiotic Indication::   Intra-abdominal Infection   OR Linked Order Group    acetaminophen  (TYLENOL ) tablet 650 mg    acetaminophen  (TYLENOL ) suppository 650 mg   OR Linked Order Group    ondansetron  (ZOFRAN ) tablet 4 mg    ondansetron  (ZOFRAN ) injection 4 mg   pantoprazole  (PROTONIX ) injection 40 mg   HYDROmorphone  (DILAUDID ) injection 0.5-1 mg   melatonin tablet 10 mg    I have reviewed the patients home medicines and have made adjustments as needed  Problem List / ED Course: Problem List Items Addressed This Visit       Digestive   * (Principal) Duodenal ulcer   On Admission, Duodenal ulcer, not yet perforated but with outpouching and looks like its pending perf.  New / progressed since duodenitis diagnosis in ED at [facility] 07/01/23.  At risk for perforation. May or may not have H. Pylori, looks like someone had him on empiric treatment as outpt for H.Pylori these past couple of weeks. IV PPI at maximum labeled dose NPO except sips with carafate  Carafate  PO Q6H Spoke with Dr. Stevie (gen surg): Add empiric ABx: Zosyn  Call GI in AM, though not sure if they will or wont want to do EGD given the outpouching and  near perforation appearance of the ulcer on CT scan. Despite ulcer presence: hemoccult negative and no anemia (HGB 14.5), doesn't seem to have significant GIB with this Dilaudid  prn pain Zofran  prn nausea Advised pt: no more NSAIDS  07-21-2023 GI and general surgery consulted. GI plans on EGD tomorrow. Continue with protonix  IV bid. NPO after MN. I see no need for IV abx at this point.      Other Visit Diagnoses       Acute duodenitis    -  Primary                   This note was created using dictation software, which may contain spelling or grammatical errors.    Franklyn Sid SAILOR, MD 07/21/23 [phone]

## 2023-07-20 NOTE — H&P (Signed)
 History and Physical    Patient: Bernard Benton FMW:[id number] DOB: 06-15-1996 DOA: 07/20/2023 DOS: the patient was seen and examined on 07/20/2023 PCP: Pcp, No  Patient coming from: Home  Chief Complaint:  Chief Complaint  Patient presents with   Abdominal Pain   HPI: Bernard Benton is a 28 y.o. male with medical history significant of depression, THC use, GERD.  Pt in to ED with c/o abdominal pain.  Pt diagnosed with duodenitis at [facility] ED on 12/16.  No ulcer seen on CT scan at that time though had a lymph node noted.  Got started on empiric ABx for H.Pylori.  Unfortunately due to ongoing pain has been taking NSAIDS fairly religiously over the past couple of weeks.  Pt in to ED today with ongoing abd pain, N/V, 10-15 dark colored stools per day.   Review of Systems: As mentioned in the history of present illness. All other systems reviewed and are negative. Past Medical History:  Diagnosis Date   Acid reflux    Depressed    Past Surgical History:  Procedure Laterality Date   HIP SURGERY     Social History:  reports that he has quit smoking. He has never used smokeless tobacco. He reports that he does not currently use alcohol. He reports current drug use. Drug: Marijuana.  Allergies  Allergen Reactions   Other     Pt reports allergic to an IV ABX but does not remember which one   Morphine  Rash    Local rash     History reviewed. No pertinent family history.  Prior to Admission medications   Medication Sig Start Date End Date Taking? Authorizing Provider  nicotine  polacrilex (NICORETTE ) 4 MG gum Take 1 each (4 mg total) by mouth as needed for smoking cessation. 05/20/23  Yes Victoria Ruts, MD  amoxicillin -clavulanate (AUGMENTIN ) 875-125 MG tablet Take 1 tablet by mouth every 12 (twelve) hours. Patient not taking: Reported on 07/20/2023 06/23/23   Teresa Shelba SAUNDERS, NP  azithromycin  (ZITHROMAX ) 250 MG tablet Take 1 tablet (250 mg total) by mouth daily. Take first 2  tablets together, then 1 every day until finished. Patient not taking: Reported on 07/20/2023 06/26/23   Teresa Shelba SAUNDERS, NP  benzonatate  (TESSALON ) 100 MG capsule Take 1 capsule (100 mg total) by mouth every 8 (eight) hours. Patient not taking: Reported on 07/20/2023 06/23/23   Teresa Shelba SAUNDERS, NP  hydrOXYzine  (ATARAX ) 25 MG tablet Take 1 tablet (25 mg total) by mouth 3 (three) times daily as needed for anxiety. 05/20/23   Victoria Ruts, MD  ondansetron  (ZOFRAN -ODT) 8 MG disintegrating tablet Take 1 tablet (8 mg total) by mouth every 8 (eight) hours as needed for nausea or vomiting. Patient not taking: Reported on 07/20/2023 07/01/23   Bradler, Evan K, MD  predniSONE  (STERAPRED UNI-PAK 21 TAB) 10 MG (21) TBPK tablet Take by mouth daily. Take 6 tabs by mouth daily  for 1 days, then 5 tabs for 1 days, then 4 tabs for 1 days, then 3 tabs for 1 days, 2 tabs for 1 days, then 1 tab by mouth daily for 1 days Patient not taking: Reported on 07/20/2023 06/23/23   Teresa Shelba SAUNDERS, NP  promethazine -dextromethorphan (PROMETHAZINE -DM) 6.25-15 MG/5ML syrup Take 5 mLs by mouth 4 (four) times daily as needed for cough. Patient not taking: Reported on 07/20/2023 06/23/23   Teresa Shelba SAUNDERS, NP  sucralfate  (CARAFATE ) 1 g tablet Take 1 tablet (1 g total) by mouth 4 (four) times daily. Patient not  taking: Reported on 07/20/2023 07/01/23 07/31/23  Bradler, Evan K, MD  traZODone  (DESYREL ) 50 MG tablet Take 1 tablet (50 mg total) by mouth at bedtime as needed for sleep. Patient not taking: Reported on 07/20/2023 05/20/23   Victoria Ruts, MD    Physical Exam: Vitals:   07/20/23 2233 07/20/23 2245 07/20/23 2300 07/20/23 2315  BP: 132/85 (!) 140/96 125/79 131/82  Pulse: (!) 46 (!) 44 (!) 53 (!) 55  Resp: 12 10 12 11   Temp: 98.1 F (36.7 C)     TempSrc: Oral     SpO2: 100% 99% 97% 98%  Weight:      Height:       Constitutional: NAD, calm, comfortable Respiratory: clear to auscultation bilaterally, no wheezing, no  crackles. Normal respiratory effort. No accessory muscle use.  Cardiovascular: Regular rate and rhythm, no murmurs / rubs / gallops. No extremity edema. 2+ pedal pulses. No carotid bruits.  Abdomen: no tenderness, no masses palpated. No hepatosplenomegaly. Bowel sounds positive.  Neurologic: CN 2-12 grossly intact. Sensation intact, DTR normal. Strength 5/5 in all 4.  Psychiatric: Normal judgment and insight. Alert and oriented x 3. Normal mood.   Data Reviewed:    Labs on Admission: I have personally reviewed following labs and imaging studies  CBC: Recent Labs  Lab 07/20/23 2014  WBC 13.6*  NEUTROABS 9.3*  HGB 14.5  HCT 42.2  MCV 89.8  PLT 340   Basic Metabolic Panel: Recent Labs  Lab 07/20/23 1645  NA 137  K 4.8  CL 103  CO2 20*  GLUCOSE 87  BUN 10  CREATININE 0.86  CALCIUM 10.0   GFR: Estimated Creatinine Clearance: 169.7 mL/min (by C-G formula based on SCr of 0.86 mg/dL). Liver Function Tests: Recent Labs  Lab 07/20/23 1645  AST 59*  ALT 75*  ALKPHOS 59  BILITOT 1.3*  PROT 8.1  ALBUMIN 4.4   Recent Labs  Lab 07/20/23 1645  LIPASE 27   No results for input(s): AMMONIA in the last 168 hours. Coagulation Profile: Recent Labs  Lab 07/20/23 2014  INR 1.0   Cardiac Enzymes: No results for input(s): CKTOTAL, CKMB, CKMBINDEX, TROPONINI in the last 168 hours. BNP (last 3 results) No results for input(s): PROBNP in the last 8760 hours. HbA1C: No results for input(s): HGBA1C in the last 72 hours. CBG: No results for input(s): GLUCAP in the last 168 hours. Lipid Profile: No results for input(s): CHOL, HDL, LDLCALC, TRIG, CHOLHDL, LDLDIRECT in the last 72 hours. Thyroid Function Tests: No results for input(s): TSH, T4TOTAL, FREET4, T3FREE, THYROIDAB in the last 72 hours. Anemia Panel: No results for input(s): VITAMINB12, FOLATE, FERRITIN, TIBC, IRON, RETICCTPCT in the last 72 hours. Urine analysis:     Component Value Date/Time   COLORURINE YELLOW (A) 10/04/2020 0622   APPEARANCEUR CLEAR (A) 10/04/2020 0622   LABSPEC >1.046 (H) 10/04/2020 0622   PHURINE 7.0 10/04/2020 0622   GLUCOSEU 150 (A) 10/04/2020 0622   HGBUR NEGATIVE 10/04/2020 0622   BILIRUBINUR NEGATIVE 10/04/2020 0622   KETONESUR 80 (A) 10/04/2020 0622   PROTEINUR NEGATIVE 10/04/2020 0622   NITRITE NEGATIVE 10/04/2020 0622   LEUKOCYTESUR NEGATIVE 10/04/2020 0622    Radiological Exams on Admission: CT ABDOMEN PELVIS W CONTRAST Result Date: 07/20/2023 CLINICAL DATA:  Abdominal pain. Recent diagnosis of duodenitis. Abdominal pain and dark coffee-ground stools and acid reflux EXAM: CT ABDOMEN AND PELVIS WITH CONTRAST TECHNIQUE: Multidetector CT imaging of the abdomen and pelvis was performed using the standard protocol following bolus administration of  intravenous contrast. RADIATION DOSE REDUCTION: This exam was performed according to the departmental dose-optimization program which includes automated exposure control, adjustment of the mA and/or kV according to patient size and/or use of iterative reconstruction technique. CONTRAST:  75mL OMNIPAQUE  IOHEXOL  350 MG/ML SOLN COMPARISON:  07/01/2023 FINDINGS: Lower chest: No acute abnormality. Hepatobiliary: Unremarkable liver. Normal gallbladder. No biliary dilation. Pancreas: Unremarkable. Spleen: Unremarkable. Adrenals/Urinary Tract: Normal adrenal glands. No urinary calculi or hydronephrosis. Bladder is unremarkable. Stomach/Bowel: Stomach is within normal limits. Mild wall thickening and adjacent stranding about the proximal duodenum. There is a new outpouching of fluid along the medial wall of the duodenum measuring 1.6 x 1.6 cm (series 3/image 28 and 6/95). This is compatible with a duodenal ulcer. No free intraperitoneal air to suggest perforation. Vascular/Lymphatic: Normal caliber aorta. 8 mm lymph node inferior to the inflamed duodenum is similar to prior (series 6/image 101).  Reproductive: Unremarkable. Other: No abdominal wall hernia. Musculoskeletal: No acute fracture. IMPRESSION: Acute duodenitis with new 1.6 cm duodenal ulcer. No free intraperitoneal air to suggest perforation. Electronically Signed   By: Norman Gatlin M.D.   On: 07/20/2023 19:37    EKG: Independently reviewed.   Assessment and Plan: * Duodenal ulcer Duodenal ulcer, not yet perforated but with outpouching and looks like its pending perf.  New / progressed since duodenitis diagnosis in ED at [facility] 07/01/23.  At risk for perforation. May or may not have H. Pylori, looks like someone had him on empiric treatment as outpt for H.Pylori these past couple of weeks. IV PPI at maximum labeled dose NPO except sips with carafate  Carafate  PO Q6H Spoke with Dr. Stevie (gen surg): Add empiric ABx: Zosyn  Call GI in AM, though not sure if they will or wont want to do EGD given the outpouching and near perforation appearance of the ulcer on CT scan. Despite ulcer presence: hemoccult negative and no anemia (HGB 14.5), doesn't seem to have significant GIB with this Dilaudid  prn pain Zofran  prn nausea Advised pt: no more NSAIDS  Mood disorder (HCC) Med rec pending      Advance Care Planning:   Code Status: Full Code  Consults: d/w Dr. Stevie, also call GI in AM  Family Communication: No family in room  Severity of Illness: The appropriate patient status for this patient is OBSERVATION. Observation status is judged to be reasonable and necessary in order to provide the required intensity of service to ensure the patient's safety. The patient's presenting symptoms, physical exam findings, and initial radiographic and laboratory data in the context of their medical condition is felt to place them at decreased risk for further clinical deterioration. Furthermore, it is anticipated that the patient will be medically stable for discharge from the hospital within 2 midnights of admission.    Author: Tysen Roesler M., DO 07/20/2023 11:39 PM  For on call review [email or web address].

## 2023-07-21 ENCOUNTER — Encounter (HOSPITAL_COMMUNITY): Payer: Self-pay | Admitting: Internal Medicine

## 2023-07-21 DIAGNOSIS — K2970 Gastritis, unspecified, without bleeding: Secondary | ICD-10-CM | POA: Diagnosis not present

## 2023-07-21 DIAGNOSIS — E66811 Obesity, class 1: Secondary | ICD-10-CM | POA: Diagnosis present

## 2023-07-21 DIAGNOSIS — F17290 Nicotine dependence, other tobacco product, uncomplicated: Secondary | ICD-10-CM | POA: Diagnosis present

## 2023-07-21 DIAGNOSIS — Z6831 Body mass index (BMI) 31.0-31.9, adult: Secondary | ICD-10-CM | POA: Diagnosis not present

## 2023-07-21 DIAGNOSIS — K2980 Duodenitis without bleeding: Secondary | ICD-10-CM | POA: Diagnosis present

## 2023-07-21 DIAGNOSIS — K2100 Gastro-esophageal reflux disease with esophagitis, without bleeding: Secondary | ICD-10-CM | POA: Diagnosis present

## 2023-07-21 DIAGNOSIS — K922 Gastrointestinal hemorrhage, unspecified: Secondary | ICD-10-CM

## 2023-07-21 DIAGNOSIS — K264 Chronic or unspecified duodenal ulcer with hemorrhage: Secondary | ICD-10-CM | POA: Diagnosis present

## 2023-07-21 DIAGNOSIS — Z888 Allergy status to other drugs, medicaments and biological substances status: Secondary | ICD-10-CM | POA: Diagnosis not present

## 2023-07-21 DIAGNOSIS — K269 Duodenal ulcer, unspecified as acute or chronic, without hemorrhage or perforation: Secondary | ICD-10-CM | POA: Diagnosis present

## 2023-07-21 DIAGNOSIS — K208 Other esophagitis without bleeding: Secondary | ICD-10-CM | POA: Diagnosis not present

## 2023-07-21 DIAGNOSIS — Z885 Allergy status to narcotic agent status: Secondary | ICD-10-CM | POA: Diagnosis not present

## 2023-07-21 DIAGNOSIS — F419 Anxiety disorder, unspecified: Secondary | ICD-10-CM | POA: Diagnosis present

## 2023-07-21 DIAGNOSIS — K449 Diaphragmatic hernia without obstruction or gangrene: Secondary | ICD-10-CM | POA: Diagnosis present

## 2023-07-21 DIAGNOSIS — Z79899 Other long term (current) drug therapy: Secondary | ICD-10-CM | POA: Diagnosis not present

## 2023-07-21 LAB — BASIC METABOLIC PANEL
Anion gap: 10 (ref 5–15)
BUN: 9 mg/dL (ref 6–20)
CO2: 23 mmol/L (ref 22–32)
Calcium: 9.1 mg/dL (ref 8.9–10.3)
Chloride: 104 mmol/L (ref 98–111)
Creatinine, Ser: 0.84 mg/dL (ref 0.61–1.24)
GFR, Estimated: >60 mL/min (ref >60)
Glucose, Bld: 93 mg/dL (ref 70–99)
Potassium: 3.8 mmol/L (ref 3.5–5.1)
Sodium: 137 mmol/L (ref 135–145)

## 2023-07-21 LAB — IRON AND TIBC
Iron: 122 ug/dL (ref 45–182)
Saturation Ratios: 34 % (ref 17.9–39.5)
TIBC: 357 ug/dL (ref 250–450)
UIBC: 235 ug/dL

## 2023-07-21 LAB — CBC
HCT: 41.2 % (ref 39.0–52.0)
Hemoglobin: 14.1 g/dL (ref 13.0–17.0)
MCH: 30.6 pg (ref 26.0–34.0)
MCHC: 34.2 g/dL (ref 30.0–36.0)
MCV: 89.4 fL (ref 80.0–100.0)
Platelets: 293 10*3/uL (ref 150–400)
RBC: 4.61 MIL/uL (ref 4.22–5.81)
RDW: 12 % (ref 11.5–15.5)
WBC: 10.1 10*3/uL (ref 4.0–10.5)
nRBC: 0 % (ref 0.0–0.2)

## 2023-07-21 LAB — HIV ANTIBODY (ROUTINE TESTING W REFLEX): HIV Screen 4th Generation wRfx: NONREACTIVE

## 2023-07-21 LAB — ABO/RH: ABO/RH(D): A POS

## 2023-07-21 LAB — LACTIC ACID, PLASMA: Lactic Acid, Venous: 0.9 mmol/L (ref 0.5–1.9)

## 2023-07-21 MED ORDER — MELATONIN 5 MG PO TABS: 10.0000 mg | ORAL_TABLET | Freq: Every evening | ORAL | Status: DC | PRN

## 2023-07-21 NOTE — Consult Note (Signed)
 CROSS [facility]OVER [facility] Reason for Consult: Abdominal pain with duodenal ulcer noted on CT scan. Referring Physician: [facility] hospitalist.  [name] is an 28 y.o. male.  HPI: Bernard Benton is a 28 year old white male with past medical history of reflux and depression requiring [facility], who initially presented to [facility] with a 1 week history of abdominal pain on 07/01/2023 a CT scan of the abdomen pelvis was done without contrast that revealed slight stranding in the area of the distal stomach and proximal duodenum with a prominent lymph node patient was discharged on Zofran  and Pepcid .  He tells me he was advised by the ER physician to take nonsteroidals but there is no documentation of this in the chart. He apparently was seen at [facility] in the emergency room a few days later but I cannot find any documentation of that visit. He was apparently given ibuprofen by the ER MD but I am not sure about this. Due to continued abdominal pain in the epigastric area radiating to the back patient has been taking large dose of nonsteroidals on outpatient basis. He denies having any melena hematochezia. He describes having some coffee grounds appearance of his stools.  He had a CT scan of the ab doing pelvis done yesterday revealed acute duodenitis with new 1.6 cm duodenal ulcer. There is no presence of intraperitoneal air to suggest perforation. Past Medical History:  Diagnosis Date   Acid reflux    Depression     Past Surgical History:  Procedure Laterality Date   HIP SURGERY     History reviewed. No pertinent family history.  Social History:  reports that he has quit smoking a few years ago but continues to vape.  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 He reports that he drinks about 2 beers per week.   Allergies:  Allergies  Allergen Reactions   Other     Pt reports allergic  to an IV ABX but does not remember which one   Morphine  Rash    Local rash     Medications: I have reviewed the patient's current medications. Prior to Admission:  Medications Prior to Admission  Medication Sig Dispense Refill Last Dose/Taking   nicotine  polacrilex (NICORETTE ) 4 MG gum Take 1 each (4 mg total) by mouth as needed for smoking cessation. 50 tablet 0 Past Week   amoxicillin -clavulanate (AUGMENTIN ) 875-125 MG tablet Take 1 tablet by mouth every 12 (twelve) hours. (Patient not taking: Reported on 07/20/2023) 14 tablet 0 Not Taking   azithromycin  (ZITHROMAX ) 250 MG tablet Take 1 tablet (250 mg total) by mouth daily. Take first 2 tablets together, then 1 every day until finished. (Patient not taking: Reported on 07/20/2023) 6 tablet 0 Not Taking   benzonatate  (TESSALON ) 100 MG capsule Take 1 capsule (100 mg total) by mouth every 8 (eight) hours. (Patient not taking: Reported on 07/20/2023) 21 capsule 0 Not Taking   hydrOXYzine  (ATARAX ) 25 MG tablet Take 1 tablet (25 mg total) by mouth 3 (three) times daily as needed for anxiety. 30 tablet 0    ondansetron  (ZOFRAN -ODT) 8 MG disintegrating tablet Take 1 tablet (8 mg total) by mouth every 8 (eight) hours as needed for nausea or vomiting. (Patient not taking: Reported on 07/20/2023) 20 tablet 0 Not Taking   predniSONE  (STERAPRED UNI-PAK 21 TAB) 10 MG (21) TBPK tablet Take by mouth daily. Take 6 tabs  by mouth daily  for 1 days, then 5 tabs for 1 days, then 4 tabs for 1 days, then 3 tabs for 1 days, 2 tabs for 1 days, then 1 tab by mouth daily for 1 days (Patient not taking: Reported on 07/20/2023) 21 tablet 0 Not Taking   promethazine -dextromethorphan (PROMETHAZINE -DM) 6.25-15 MG/5ML syrup Take 5 mLs by mouth 4 (four) times daily as needed for cough. (Patient not taking: Reported on 07/20/2023) 118 mL 0 Not Taking   sucralfate  (CARAFATE ) 1 g tablet Take 1 tablet (1 g total) by mouth 4 (four) times daily. (Patient not taking: Reported on 07/20/2023) 120  tablet 0 Not Taking   traZODone  (DESYREL ) 50 MG tablet Take 1 tablet (50 mg total) by mouth at bedtime as needed for sleep. (Patient not taking: Reported on 07/20/2023) 30 tablet 0 Not Taking   Scheduled:  pantoprazole  (PROTONIX ) IV  40 mg Intravenous Q12H   sucralfate   1 g Oral Q6H   Continuous:  lactated ringers  125 mL/hr at 07/21/23 9094   piperacillin -tazobactam (ZOSYN )  IV 3.375 g (07/21/23 0534)   PRN:acetaminophen  **OR** acetaminophen , HYDROmorphone  (DILAUDID ) injection, ondansetron  **OR** ondansetron  (ZOFRAN ) IV  Results for orders placed or performed during the hospital encounter of 07/20/23 (from the past 48 hours)  Comprehensive metabolic panel     Status: Abnormal   Collection Time: 07/20/23  4:45 PM  Result Value Ref Range   Sodium 137 135 - 145 mmol/L   Potassium 4.8 3.5 - 5.1 mmol/L    Comment: HEMOLYSIS AT THIS LEVEL MAY AFFECT RESULT   Chloride 103 98 - 111 mmol/L   CO2 20 (L) 22 - 32 mmol/L   Glucose, Bld 87 70 - 99 mg/dL    Comment: Glucose reference range applies only to samples taken after fasting for at least 8 hours.   BUN 10 6 - 20 mg/dL   Creatinine, Ser 9.13 0.61 - 1.24 mg/dL   Calcium 89.9 8.9 - 89.6 mg/dL   Total Protein 8.1 6.5 - 8.1 g/dL   Albumin 4.4 3.5 - 5.0 g/dL   AST 59 (H) 15 - 41 U/L    Comment: HEMOLYSIS AT THIS LEVEL MAY AFFECT RESULT   ALT 75 (H) 0 - 44 U/L    Comment: HEMOLYSIS AT THIS LEVEL MAY AFFECT RESULT   Alkaline Phosphatase 59 38 - 126 U/L   Total Bilirubin 1.3 (H) 0.0 - 1.2 mg/dL    Comment: HEMOLYSIS AT THIS LEVEL MAY AFFECT RESULT   GFR, Estimated >60 >60 mL/min    Comment: (NOTE) Calculated using the CKD-EPI Creatinine Equation (2021)    Anion gap 14 5 - 15    Comment: Performed at [facility], [address] [place], [place] [address]  Lipase, blood     Status: None   Collection Time: 07/20/23  4:45 PM  Result Value Ref Range   Lipase 27 11 - 51 U/L    Comment: HEMOLYSIS AT THIS LEVEL MAY AFFECT  RESULT Performed at [facility], [address] [place], [place] [address]   Type and screen [facility]     Status: None   Collection Time: 07/20/23  8:11 PM  Result Value Ref Range   ABO/RH(D) A POS    Antibody Screen NEG    Sample Expiration      07/23/2023,2359 Performed at [facility], [address], [place], [place] [address]   CBC with Differential/Platelet     Status: Abnormal   Collection  Time: 07/20/23  8:14 PM  Result Value Ref Range   WBC 13.6 (H) 4.0 - 10.5 K/uL   RBC 4.70 4.22 - 5.81 MIL/uL   Hemoglobin 14.5 13.0 - 17.0 g/dL   HCT 57.7 60.9 - 47.9 %   MCV 89.8 80.0 - 100.0 fL   MCH 30.9 26.0 - 34.0 pg   MCHC 34.4 30.0 - 36.0 g/dL   RDW 87.9 88.4 - 84.4 %   Platelets 340 150 - 400 K/uL   nRBC 0.0 0.0 - 0.2 %   Neutrophils Relative % 69 %   Neutro Abs 9.3 (H) 1.7 - 7.7 K/uL   Lymphocytes Relative 23 %   Lymphs Abs 3.2 0.7 - 4.0 K/uL   Monocytes Relative 6 %   Monocytes Absolute 0.8 0.1 - 1.0 K/uL   Eosinophils Relative 1 %   Eosinophils Absolute 0.2 0.0 - 0.5 K/uL   Basophils Relative 1 %   Basophils Absolute 0.1 0.0 - 0.1 K/uL   Immature Granulocytes 0 %   Abs Immature Granulocytes 0.06 0.00 - 0.07 K/uL    Comment: Performed at [facility], [address], [place], [place] [address]  Protime-INR     Status: None   Collection Time: 07/20/23  8:14 PM  Result Value Ref Range   Prothrombin Time 13.4 11.4 - 15.2 seconds   INR 1.0 0.8 - 1.2    Comment: (NOTE) INR goal varies based on device and disease states. Performed at [facility], [address] [place], [place] [address]   Lactic acid, plasma     Status: None   Collection Time: 07/20/23  8:14 PM  Result Value Ref Range   Lactic Acid, Venous 1.1 0.5 - 1.9 mmol/L    Comment: Performed at [facility], [address] [place], [place] [address]  ABO/Rh     Status: None   Collection Time: 07/20/23  8:20 PM  Result Value Ref Range   ABO/RH(D)      A  POS Performed at [facility], [address], [place], [place] [address]   POC occult blood, ED     Status: None   Collection Time: 07/20/23  8:49 PM  Result Value Ref Range   Fecal Occult Bld NEGATIVE NEGATIVE  Lactic acid, plasma     Status: None   Collection Time: 07/21/23  1:06 AM  Result Value Ref Range   Lactic Acid, Venous 0.9 0.5 - 1.9 mmol/L    Comment: Performed at [facility], [address], [place], [place] [address]  HIV Antibody (routine testing w rflx)     Status: None   Collection Time: 07/21/23  1:06 AM  Result Value Ref Range   HIV Screen 4th Generation wRfx Non Reactive Non Reactive    Comment: Performed at [facility], [address], [place], [place] [address]  CBC     Status: None   Collection Time: 07/21/23  2:25 AM  Result Value Ref Range   WBC 10.1 4.0 - 10.5 K/uL   RBC 4.61 4.22 - 5.81 MIL/uL   Hemoglobin 14.1 13.0 - 17.0 g/dL   HCT 58.7 60.9 - 47.9 %   MCV 89.4 80.0 - 100.0 fL   MCH 30.6 26.0 - 34.0 pg   MCHC 34.2 30.0 - 36.0 g/dL   RDW 87.9 88.4 - 84.4 %   Platelets 293 150 - 400 K/uL   nRBC 0.0 0.0 - 0.2 %  Comment: Performed at [facility], [address], [place], [place] [address]  Basic metabolic panel     Status: None   Collection Time: 07/21/23  2:25 AM  Result Value Ref Range   Sodium 137 135 - 145 mmol/L   Potassium 3.8 3.5 - 5.1 mmol/L   Chloride 104 98 - 111 mmol/L   CO2 23 22 - 32 mmol/L   Glucose, Bld 93 70 - 99 mg/dL    Comment: Glucose reference range applies only to samples taken after fasting for at least 8 hours.   BUN 9 6 - 20 mg/dL   Creatinine, Ser 9.15 0.61 - 1.24 mg/dL   Calcium 9.1 8.9 - 89.6 mg/dL   GFR, Estimated >39 >39 mL/min    Comment: (NOTE) Calculated using the CKD-EPI Creatinine Equation (2021)    Anion gap 10 5 - 15    Comment: Performed at [facility], [address], [place], [place] [address]    CT ABDOMEN PELVIS W CONTRAST Result Date: 07/20/2023 CLINICAL DATA:   Abdominal pain. Recent diagnosis of duodenitis. Abdominal pain and dark coffee-ground stools and acid reflux EXAM: CT ABDOMEN AND PELVIS WITH CONTRAST TECHNIQUE: Multidetector CT imaging of the abdomen and pelvis was performed using the standard protocol following bolus administration of intravenous contrast. RADIATION DOSE REDUCTION: This exam was performed according to the departmental dose-optimization program which includes automated exposure control, adjustment of the mA and/or kV according to patient size and/or use of iterative reconstruction technique. CONTRAST:  75mL OMNIPAQUE  IOHEXOL  350 MG/ML SOLN COMPARISON:  07/01/2023 FINDINGS: Lower chest: No acute abnormality. Hepatobiliary: Unremarkable liver. Normal gallbladder. No biliary dilation. Pancreas: Unremarkable. Spleen: Unremarkable. Adrenals/Urinary Tract: Normal adrenal glands. No urinary calculi or hydronephrosis. Bladder is unremarkable. Stomach/Bowel: Stomach is within normal limits. Mild wall thickening and adjacent stranding about the proximal duodenum. There is a new outpouching of fluid along the medial wall of the duodenum measuring 1.6 x 1.6 cm (series 3/image 28 and 6/95). This is compatible with a duodenal ulcer. No free intraperitoneal air to suggest perforation. Vascular/Lymphatic: Normal caliber aorta. 8 mm lymph node inferior to the inflamed duodenum is similar to prior (series 6/image 101). Reproductive: Unremarkable. Other: No abdominal wall hernia. Musculoskeletal: No acute fracture. IMPRESSION: Acute duodenitis with new 1.6 cm duodenal ulcer. No free intraperitoneal air to suggest perforation. Electronically Signed   By: Norman Gatlin M.D.   On: 07/20/2023 19:37   Review of Systems  Constitutional:  Negative for activity change, appetite change, chills, diaphoresis, fatigue, fever and unexpected weight change.  HENT: Negative.    Eyes: Negative.   Respiratory: Negative.    Cardiovascular: Negative.   Gastrointestinal:   Positive for abdominal pain. Negative for anal bleeding, blood in stool, constipation, diarrhea, nausea, rectal pain and vomiting.  Musculoskeletal: Negative.   Skin: Negative.   Allergic/Immunologic: Negative.   Neurological: Negative.   Hematological: Negative.   Psychiatric/Behavioral:  The patient is nervous/anxious.    Blood pressure (!) 152/98, pulse (!) 54, temperature 97.8 F (36.6 C), temperature source Oral, resp. rate 18, height 6' 2 (1.88 m), weight 111.1 kg, SpO2 99%. Physical Exam Constitutional:      General: He is not in acute distress.    Appearance: He is well-developed. He is not ill-appearing.  HENT:     Head: Normocephalic and atraumatic.  Eyes:     Extraocular Movements: Extraocular movements intact.     Pupils: Pupils are equal, round, and reactive to light.  Cardiovascular:  Rate and Rhythm: Normal rate and regular rhythm.     Heart sounds: Normal heart sounds.  Pulmonary:     Effort: Pulmonary effort is normal.     Breath sounds: Normal breath sounds.  Abdominal:     General: Abdomen is flat.     Tenderness: There is abdominal tenderness in the epigastric area.  Skin:    General: Skin is warm and dry.  Neurological:     General: No focal deficit present.     Mental Status: He is alert and oriented to person, place, and time.  Psychiatric:        Mood and Affect: Mood normal.        Behavior: Behavior normal.   Assessment/Plan: 1)  Epigastric pain with a duodenal ulcer noted on CT scan-we will schedule the patient for an EGD tomorrow. His hemoglobin is stable at 14.1 g/dl-patient has been strongly counseled against use of all nonsteroidals. 2) History of depression. 3) History of tobacco and marijuana use. 4) Obesity.  Renaye Sous 07/21/2023, 9:42 AM

## 2023-07-21 NOTE — Hospital Course (Addendum)
 HPI: ROYCE STEGMAN is a 28 y.o. male with medical history significant of depression, THC use, GERD.   Pt in to ED with c/o abdominal pain.  Pt diagnosed with duodenitis at [facility] ED on 12/16.  No ulcer seen on CT scan at that time though had a lymph node noted.  Got started on empiric ABx for H.Pylori.   Unfortunately due to ongoing pain has been taking NSAIDS fairly religiously over the past couple of weeks.   Pt in to ED today with ongoing abd pain, N/V, 10-15 dark colored stools per day.  Significant Events: Admitted 07/20/2023 for duodenal ulcer and UGI bleeding   Significant Labs: WBC 13.6, HgB 9.3, BUN 10, Scr 0.86 AST 59, ALT 75, T. Bili 1.3  Significant Imaging Studies: Ct abd/pelvis shows Acute duodenitis with new 1.6 cm duodenal ulcer. No free intraperitoneal air to suggest perforation   Antibiotic Therapy: Anti-infectives (From admission, onward)    Start     Dose/Rate Route Frequency Ordered Stop   07/20/23 2230  piperacillin -tazobactam (ZOSYN ) IVPB 3.375 g        3.375 g 12.5 mL/hr over 240 Minutes Intravenous Every 8 hours 07/20/23 2222         Procedures: 07-22-2023 EGD showed Grade C esophagitis and non-bleeding duodenal ulcer.   Consultants: GI General surgery

## 2023-07-21 NOTE — Progress Notes (Signed)
 PROGRESS NOTE    Bernard Benton  FMW:[id number] DOB: 19-Jun-1996 DOA: 07/20/2023 PCP: Pcp, No  Subjective: Pt seen and examined with general surgery. Pt has been taking 400 mg qid of ibuprofen every day for 4 weeks. CT abd shows duodenal ulcer. GI planning on EGD tomorrow. On clear liquids. NPO after MN.   Hospital Course: HPI: Bernard Benton is a 28 y.o. male with medical history significant of depression, THC use, GERD.   Pt in to ED with c/o abdominal pain.  Pt diagnosed with duodenitis at [facility] ED on 12/16.  No ulcer seen on CT scan at that time though had a lymph node noted.  Got started on empiric ABx for H.Pylori.   Unfortunately due to ongoing pain has been taking NSAIDS fairly religiously over the past couple of weeks.   Pt in to ED today with ongoing abd pain, N/V, 10-15 dark colored stools per day.  Significant Events: Admitted 07/20/2023 for duodenal ulcer and UGI bleeding   Significant Labs: WBC 13.6, HgB 9.3, BUN 10, Scr 0.86 AST 59, ALT 75, T. Bili 1.3  Significant Imaging Studies: Ct abd/pelvis shows Acute duodenitis with new 1.6 cm duodenal ulcer. No free intraperitoneal air to suggest perforation   Antibiotic Therapy: Anti-infectives (From admission, onward)    Start     Dose/Rate Route Frequency Ordered Stop   07/20/23 2230  piperacillin -tazobactam (ZOSYN ) IVPB 3.375 g        3.375 g 12.5 mL/hr over 240 Minutes Intravenous Every 8 hours 07/20/23 2222         Procedures:   Consultants: GI General surgery    Assessment and Plan: * Duodenal ulcer On Admission, Duodenal ulcer, not yet perforated but with outpouching and looks like its pending perf.  New / progressed since duodenitis diagnosis in ED at [facility] 07/01/23.  At risk for perforation. May or may not have H. Pylori, looks like someone had him on empiric treatment as outpt for H.Pylori these past couple of weeks. IV PPI at maximum labeled dose NPO except sips with carafate  Carafate  PO  Q6H Spoke with Dr. Stevie (gen surg): Add empiric ABx: Zosyn  Call GI in AM, though not sure if they will or wont want to do EGD given the outpouching and near perforation appearance of the ulcer on CT scan. Despite ulcer presence: hemoccult negative and no anemia (HGB 14.5), doesn't seem to have significant GIB with this Dilaudid  prn pain Zofran  prn nausea Advised pt: no more NSAIDS  07-21-2023 GI and general surgery consulted. GI plans on EGD tomorrow. Continue with protonix  IV bid. NPO after MN. I see no need for IV abx at this point.  Upper GI bleeding 07-21-2023 due to duodenal ulcer. 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  Mood disorder (HCC) Stable. Only on prn hydroxyzine  for anxiety.   DVT prophylaxis: SCDs Start: 07/20/23 2237    Code Status: Full Code Family Communication: no family at bedside. Pt is decisional Disposition Plan: return home Reason for continuing need for hospitalization: EGD tomorrow. On IV protonix .  Objective: Vitals:   07/20/23 2315 07/21/23 0042 07/21/23 0500 07/21/23 0749  BP: 131/82 (!) 147/95 135/81 (!) 152/98  Pulse: (!) 55 (!) 56 61 (!) 54  Resp: 11 17 17 18   Temp:  98 F (36.7 C) 97.6 F (36.4 C) 97.8 F (36.6 C)  TempSrc:  Oral Oral Oral  SpO2: 98% 98% 98% 99%  Weight:      Height:  Intake/Output Summary (Last 24 hours) at 07/21/2023 1320 Last data filed at 07/21/2023 0302 Gross per 24 hour  Intake 28.21 ml  Output --  Net 28.21 ml   Filed Weights   07/20/23 1625  Weight: 111.1 kg    Examination:  Physical Exam Vitals and nursing note reviewed.  Constitutional:      General: He is not in acute distress.    Appearance: He is not diaphoretic.  HENT:     Head: Normocephalic and atraumatic.  Eyes:     General: No scleral icterus. Cardiovascular:     Rate and Rhythm: Normal rate and regular rhythm.  Pulmonary:     Effort: Pulmonary effort is normal.     Breath sounds: Normal breath sounds.  Abdominal:      General: Bowel sounds are normal. There is no distension.     Palpations: Abdomen is soft.     Tenderness: There is no abdominal tenderness.  Skin:    General: Skin is warm and dry.     Capillary Refill: Capillary refill takes less than 2 seconds.  Neurological:     General: No focal deficit present.     Mental Status: He is alert and oriented to person, place, and time.     Data Reviewed: I have personally reviewed following labs and imaging studies  CBC: Recent Labs  Lab 07/20/23 2014 07/21/23 0225  WBC 13.6* 10.1  NEUTROABS 9.3*  --   HGB 14.5 14.1  HCT 42.2 41.2  MCV 89.8 89.4  PLT 340 293   Basic Metabolic Panel: Recent Labs  Lab 07/20/23 1645 07/21/23 0225  NA 137 137  K 4.8 3.8  CL 103 104  CO2 20* 23  GLUCOSE 87 93  BUN 10 9  CREATININE 0.86 0.84  CALCIUM 10.0 9.1   GFR: Estimated Creatinine Clearance: 173.7 mL/min (by C-G formula based on SCr of 0.84 mg/dL). Liver Function Tests: Recent Labs  Lab 07/20/23 1645  AST 59*  ALT 75*  ALKPHOS 59  BILITOT 1.3*  PROT 8.1  ALBUMIN 4.4   Recent Labs  Lab 07/20/23 1645  LIPASE 27   Coagulation Profile: Recent Labs  Lab 07/20/23 2014  INR 1.0   Sepsis Labs: Recent Labs  Lab 07/20/23 2014 07/21/23 0106  LATICACIDVEN 1.1 0.9   Radiology Studies: CT ABDOMEN PELVIS W CONTRAST Result Date: 07/20/2023 CLINICAL DATA:  Abdominal pain. Recent diagnosis of duodenitis. Abdominal pain and dark coffee-ground stools and acid reflux EXAM: CT ABDOMEN AND PELVIS WITH CONTRAST TECHNIQUE: Multidetector CT imaging of the abdomen and pelvis was performed using the standard protocol following bolus administration of intravenous contrast. RADIATION DOSE REDUCTION: This exam was performed according to the departmental dose-optimization program which includes automated exposure control, adjustment of the mA and/or kV according to patient size and/or use of iterative reconstruction technique. CONTRAST:  75mL OMNIPAQUE   IOHEXOL  350 MG/ML SOLN COMPARISON:  07/01/2023 FINDINGS: Lower chest: No acute abnormality. Hepatobiliary: Unremarkable liver. Normal gallbladder. No biliary dilation. Pancreas: Unremarkable. Spleen: Unremarkable. Adrenals/Urinary Tract: Normal adrenal glands. No urinary calculi or hydronephrosis. Bladder is unremarkable. Stomach/Bowel: Stomach is within normal limits. Mild wall thickening and adjacent stranding about the proximal duodenum. There is a new outpouching of fluid along the medial wall of the duodenum measuring 1.6 x 1.6 cm (series 3/image 28 and 6/95). This is compatible with a duodenal ulcer. No free intraperitoneal air to suggest perforation. Vascular/Lymphatic: Normal caliber aorta. 8 mm lymph node inferior to the inflamed duodenum is similar to prior (series  6/image 101). Reproductive: Unremarkable. Other: No abdominal wall hernia. Musculoskeletal: No acute fracture. IMPRESSION: Acute duodenitis with new 1.6 cm duodenal ulcer. No free intraperitoneal air to suggest perforation. Electronically Signed   By: Norman Gatlin M.D.   On: 07/20/2023 19:37    Scheduled Meds:  pantoprazole  (PROTONIX ) IV  40 mg Intravenous Q12H   Continuous Infusions:   LOS: 0 days   Time spent: 35 minutes  Camellia Door, DO  [facility]  07/21/2023, 1:20 PM

## 2023-07-21 NOTE — Consult Note (Signed)
 Bernard Benton 12-05-1995  [id number].    Requesting MD: Laurence Chief Complaint/Reason for Consult: Duodenal ulcer  HPI:  28  y/o M w/ a hx of GERD and depression who presented to the ED with persistent abdominal pain w/ imaging showing duodenitis w/ a 1.6cm ulcer.  He has presented to other hospitals at least twice in the past month. On 12/16 he had a CT that showed stranding in the area of the distal stomach/proximal duodenum.  He reports that he was told to take NSAIDs to help with the pain, which he figured would last only a few days. The pain persisted and he has been taking several doses of ibuprofen per day since then.   On exam, patient is resting comfortably. NAD.  WBC 10, Hb 14.  He is AF and HDS. HR 70s-80s.   ROS: Review of Systems  Constitutional: Negative.   HENT: Negative.    Eyes: Negative.   Respiratory: Negative.    Cardiovascular: Negative.   Gastrointestinal:  Positive for abdominal pain.  Genitourinary: Negative.   Musculoskeletal: Negative.   Skin: Negative.   Neurological: Negative.   Endo/Heme/Allergies: Negative.   Psychiatric/Behavioral: Negative.      History reviewed. No pertinent family history.  Past Medical History:  Diagnosis Date   Acid reflux    Depressed     Past Surgical History:  Procedure Laterality Date   HIP SURGERY      Social History:  reports that he has quit smoking. He has never used smokeless tobacco. He reports that he does not currently use alcohol. He reports current drug use. Drug: Marijuana.  Allergies:  Allergies  Allergen Reactions   Other     Pt reports allergic to an IV ABX but does not remember which one   Morphine  Rash    Local rash     Medications Prior to Admission  Medication Sig Dispense Refill   nicotine  polacrilex (NICORETTE ) 4 MG gum Take 1 each (4 mg total) by mouth as needed for smoking cessation. 50 tablet 0   amoxicillin -clavulanate (AUGMENTIN ) 875-125 MG tablet Take 1 tablet by mouth  every 12 (twelve) hours. (Patient not taking: Reported on 07/20/2023) 14 tablet 0   azithromycin  (ZITHROMAX ) 250 MG tablet Take 1 tablet (250 mg total) by mouth daily. Take first 2 tablets together, then 1 every day until finished. (Patient not taking: Reported on 07/20/2023) 6 tablet 0   benzonatate  (TESSALON ) 100 MG capsule Take 1 capsule (100 mg total) by mouth every 8 (eight) hours. (Patient not taking: Reported on 07/20/2023) 21 capsule 0   hydrOXYzine  (ATARAX ) 25 MG tablet Take 1 tablet (25 mg total) by mouth 3 (three) times daily as needed for anxiety. 30 tablet 0   ondansetron  (ZOFRAN -ODT) 8 MG disintegrating tablet Take 1 tablet (8 mg total) by mouth every 8 (eight) hours as needed for nausea or vomiting. (Patient not taking: Reported on 07/20/2023) 20 tablet 0   predniSONE  (STERAPRED UNI-PAK 21 TAB) 10 MG (21) TBPK tablet Take by mouth daily. Take 6 tabs by mouth daily  for 1 days, then 5 tabs for 1 days, then 4 tabs for 1 days, then 3 tabs for 1 days, 2 tabs for 1 days, then 1 tab by mouth daily for 1 days (Patient not taking: Reported on 07/20/2023) 21 tablet 0   promethazine -dextromethorphan (PROMETHAZINE -DM) 6.25-15 MG/5ML syrup Take 5 mLs by mouth 4 (four) times daily as needed for cough. (Patient not taking: Reported on 07/20/2023) 118 mL 0  sucralfate  (CARAFATE ) 1 g tablet Take 1 tablet (1 g total) by mouth 4 (four) times daily. (Patient not taking: Reported on 07/20/2023) 120 tablet 0   traZODone  (DESYREL ) 50 MG tablet Take 1 tablet (50 mg total) by mouth at bedtime as needed for sleep. (Patient not taking: Reported on 07/20/2023) 30 tablet 0    Physical Exam: Blood pressure (!) 152/98, pulse (!) 54, temperature 97.8 F (36.6 C), temperature source Oral, resp. rate 18, height 6' 2 (1.88 m), weight 111.1 kg, SpO2 99%. Gen: male resting, NAD Abd: soft, non-distended, mild TTP in the LUQ, no rebound/guarding, no peritoneal signs  Results for orders placed or performed during the hospital encounter  of 07/20/23 (from the past 48 hours)  Comprehensive metabolic panel     Status: Abnormal   Collection Time: 07/20/23  4:45 PM  Result Value Ref Range   Sodium 137 135 - 145 mmol/L   Potassium 4.8 3.5 - 5.1 mmol/L    Comment: HEMOLYSIS AT THIS LEVEL MAY AFFECT RESULT   Chloride 103 98 - 111 mmol/L   CO2 20 (L) 22 - 32 mmol/L   Glucose, Bld 87 70 - 99 mg/dL    Comment: Glucose reference range applies only to samples taken after fasting for at least 8 hours.   BUN 10 6 - 20 mg/dL   Creatinine, Ser 9.13 0.61 - 1.24 mg/dL   Calcium 89.9 8.9 - 89.6 mg/dL   Total Protein 8.1 6.5 - 8.1 g/dL   Albumin 4.4 3.5 - 5.0 g/dL   AST 59 (H) 15 - 41 U/L    Comment: HEMOLYSIS AT THIS LEVEL MAY AFFECT RESULT   ALT 75 (H) 0 - 44 U/L    Comment: HEMOLYSIS AT THIS LEVEL MAY AFFECT RESULT   Alkaline Phosphatase 59 38 - 126 U/L   Total Bilirubin 1.3 (H) 0.0 - 1.2 mg/dL    Comment: HEMOLYSIS AT THIS LEVEL MAY AFFECT RESULT   GFR, Estimated >60 >60 mL/min    Comment: (NOTE) Calculated using the CKD-EPI Creatinine Equation (2021)    Anion gap 14 5 - 15    Comment: Performed at [facility], [address] [place], [place] [address]  Lipase, blood     Status: None   Collection Time: 07/20/23  4:45 PM  Result Value Ref Range   Lipase 27 11 - 51 U/L    Comment: HEMOLYSIS AT THIS LEVEL MAY AFFECT RESULT Performed at [facility], [address], [place], [place] [address]   Type and screen [facility]     Status: None   Collection Time: 07/20/23  8:11 PM  Result Value Ref Range   ABO/RH(D) A POS    Antibody Screen NEG    Sample Expiration      07/23/2023,2359 Performed at [facility], [address], [place], [place] [address]   CBC with Differential/Platelet     Status: Abnormal   Collection Time: 07/20/23  8:14 PM  Result Value Ref Range   WBC 13.6 (H) 4.0 - 10.5 K/uL   RBC 4.70 4.22 - 5.81 MIL/uL   Hemoglobin 14.5 13.0 - 17.0 g/dL   HCT 57.7 60.9 - 47.9 %    MCV 89.8 80.0 - 100.0 fL   MCH 30.9 26.0 - 34.0 pg   MCHC 34.4 30.0 - 36.0 g/dL   RDW 87.9 88.4 - 84.4 %   Platelets 340 150 - 400 K/uL   nRBC 0.0 0.0 - 0.2 %  Neutrophils Relative % 69 %   Neutro Abs 9.3 (H) 1.7 - 7.7 K/uL   Lymphocytes Relative 23 %   Lymphs Abs 3.2 0.7 - 4.0 K/uL   Monocytes Relative 6 %   Monocytes Absolute 0.8 0.1 - 1.0 K/uL   Eosinophils Relative 1 %   Eosinophils Absolute 0.2 0.0 - 0.5 K/uL   Basophils Relative 1 %   Basophils Absolute 0.1 0.0 - 0.1 K/uL   Immature Granulocytes 0 %   Abs Immature Granulocytes 0.06 0.00 - 0.07 K/uL    Comment: Performed at [facility], [address], [place], [place] [address]  Protime-INR     Status: None   Collection Time: 07/20/23  8:14 PM  Result Value Ref Range   Prothrombin Time 13.4 11.4 - 15.2 seconds   INR 1.0 0.8 - 1.2    Comment: (NOTE) INR goal varies based on device and disease states. Performed at [facility], [address], [place], [place] [address]   Lactic acid, plasma     Status: None   Collection Time: 07/20/23  8:14 PM  Result Value Ref Range   Lactic Acid, Venous 1.1 0.5 - 1.9 mmol/L    Comment: Performed at [facility], [address] [place], [place] [address]  ABO/Rh     Status: None   Collection Time: 07/20/23  8:20 PM  Result Value Ref Range   ABO/RH(D)      A POS Performed at [facility], [address], [place], [place] [address]   POC occult blood, ED     Status: None   Collection Time: 07/20/23  8:49 PM  Result Value Ref Range   Fecal Occult Bld NEGATIVE NEGATIVE  Lactic acid, plasma     Status: None   Collection Time: 07/21/23  1:06 AM  Result Value Ref Range   Lactic Acid, Venous 0.9 0.5 - 1.9 mmol/L    Comment: Performed at [facility], [address] [place], [place] [address]  HIV Antibody (routine testing w rflx)     Status: None   Collection Time: 07/21/23  1:06 AM  Result Value Ref Range   HIV Screen 4th Generation wRfx Non  Reactive Non Reactive    Comment: Performed at [facility], [address], [place], [place] [address]  CBC     Status: None   Collection Time: 07/21/23  2:25 AM  Result Value Ref Range   WBC 10.1 4.0 - 10.5 K/uL   RBC 4.61 4.22 - 5.81 MIL/uL   Hemoglobin 14.1 13.0 - 17.0 g/dL   HCT 58.7 60.9 - 47.9 %   MCV 89.4 80.0 - 100.0 fL   MCH 30.6 26.0 - 34.0 pg   MCHC 34.2 30.0 - 36.0 g/dL   RDW 87.9 88.4 - 84.4 %   Platelets 293 150 - 400 K/uL   nRBC 0.0 0.0 - 0.2 %    Comment: Performed at [facility], [address] [place], [place] [address]  Basic metabolic panel     Status: None   Collection Time: 07/21/23  2:25 AM  Result Value Ref Range   Sodium 137 135 - 145 mmol/L   Potassium 3.8 3.5 - 5.1 mmol/L   Chloride 104 98 - 111 mmol/L   CO2 23 22 - 32 mmol/L   Glucose, Bld 93 70 - 99 mg/dL    Comment: Glucose reference range applies only to samples taken after fasting  for at least 8 hours.   BUN 9 6 - 20 mg/dL   Creatinine, Ser 9.15 0.61 - 1.24 mg/dL   Calcium 9.1 8.9 - 89.6 mg/dL   GFR, Estimated >39 >39 mL/min    Comment: (NOTE) Calculated using the CKD-EPI Creatinine Equation (2021)    Anion gap 10 5 - 15    Comment: Performed at [facility], [address], [place], [place] [address]   CT ABDOMEN PELVIS W CONTRAST Result Date: 07/20/2023 CLINICAL DATA:  Abdominal pain. Recent diagnosis of duodenitis. Abdominal pain and dark coffee-ground stools and acid reflux EXAM: CT ABDOMEN AND PELVIS WITH CONTRAST TECHNIQUE: Multidetector CT imaging of the abdomen and pelvis was performed using the standard protocol following bolus administration of intravenous contrast. RADIATION DOSE REDUCTION: This exam was performed according to the departmental dose-optimization program which includes automated exposure control, adjustment of the mA and/or kV according to patient size and/or use of iterative reconstruction technique. CONTRAST:  75mL OMNIPAQUE  IOHEXOL  350 MG/ML SOLN  COMPARISON:  07/01/2023 FINDINGS: Lower chest: No acute abnormality. Hepatobiliary: Unremarkable liver. Normal gallbladder. No biliary dilation. Pancreas: Unremarkable. Spleen: Unremarkable. Adrenals/Urinary Tract: Normal adrenal glands. No urinary calculi or hydronephrosis. Bladder is unremarkable. Stomach/Bowel: Stomach is within normal limits. Mild wall thickening and adjacent stranding about the proximal duodenum. There is a new outpouching of fluid along the medial wall of the duodenum measuring 1.6 x 1.6 cm (series 3/image 28 and 6/95). This is compatible with a duodenal ulcer. No free intraperitoneal air to suggest perforation. Vascular/Lymphatic: Normal caliber aorta. 8 mm lymph node inferior to the inflamed duodenum is similar to prior (series 6/image 101). Reproductive: Unremarkable. Other: No abdominal wall hernia. Musculoskeletal: No acute fracture. IMPRESSION: Acute duodenitis with new 1.6 cm duodenal ulcer. No free intraperitoneal air to suggest perforation. Electronically Signed   By: Norman Gatlin M.D.   On: 07/20/2023 19:37    Assessment/Plan 28 y/o M w/ a 1.6cm duodenal ulcer identified on CT  - No indication for urgent surgical intervention  - Agree with GI consult. Plan for EGD 1/6 - There is no sign of perforation on his CT and his abdominal exam is relatively benign.  If he develops clinical changes then please reach out to the on call surgeon to re-assess.   - Surgery will follow along  Cordella DELENA Polly Marlis [facility]on Surgery 07/21/2023, 12:43 PM Please see Amion for pager number during day hours 7:00am-4:30pm or 7:00am -11:30am on weekends

## 2023-07-21 NOTE — H&P (View-Only) (Signed)
 CROSS [facility]OVER [facility] Reason for Consult: Abdominal pain with duodenal ulcer noted on CT scan. Referring Physician: [facility] hospital[facility].  Bernard Benton is an 28 y.o. male.  HPI: Bernard Benton is a 28 year old white male with past medical history of reflux and depression requiring [facility], who initially presented to [facility] with a 1 week history of abdominal pain on 07/01/2023 a CT scan of the abdomen pelvis was done without contrast that revealed slight stranding in the area of the distal stomach and proximal duodenum with a prominent lymph node patient was discharged on Zofran  and Pepcid .  He tells me he was advised by the ER physician to take nonsteroidals but there is no documentation of this in the chart. He apparently was seen at [facility] in the emergency room a few days later but I cannot find any documentation of that visit. He was apparently given ibuprofen by the ER MD but I am not sure about this. Due to continued abdominal pain in the epigastric area radiating to the back patient has been taking large dose of nonsteroidals on outpatient basis. He denies having any melena hematochezia. He describes having some coffee grounds appearance of his stools.  He had a CT scan of the ab doing pelvis done yesterday revealed acute duodenitis with new 1.6 cm duodenal ulcer. There is no presence of intraperitoneal air to suggest perforation. Past Medical History:  Diagnosis Date   Acid reflux    Depression     Past Surgical History:  Procedure Laterality Date   HIP SURGERY     History reviewed. No pertinent family history.  Social History:  reports that he has quit smoking a few years ago but continues to vape.  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 He reports that he drinks about 2 beers per week.   Allergies:  Allergies  Allergen Reactions   Other     Pt reports allergic  to an IV ABX but does not remember which one   Morphine  Rash    Local rash     Medications: I have reviewed the patient's current medications. Prior to Admission:  Medications Prior to Admission  Medication Sig Dispense Refill Last Dose/Taking   nicotine  polacrilex (NICORETTE ) 4 MG gum Take 1 each (4 mg total) by mouth as needed for smoking cessation. 50 tablet 0 Past Week   amoxicillin -clavulanate (AUGMENTIN ) 875-125 MG tablet Take 1 tablet by mouth every 12 (twelve) hours. (Patient not taking: Reported on 07/20/2023) 14 tablet 0 Not Taking   azithromycin  (ZITHROMAX ) 250 MG tablet Take 1 tablet (250 mg total) by mouth daily. Take first 2 tablets together, then 1 every day until finished. (Patient not taking: Reported on 07/20/2023) 6 tablet 0 Not Taking   benzonatate  (TESSALON ) 100 MG capsule Take 1 capsule (100 mg total) by mouth every 8 (eight) hours. (Patient not taking: Reported on 07/20/2023) 21 capsule 0 Not Taking   hydrOXYzine  (ATARAX ) 25 MG tablet Take 1 tablet (25 mg total) by mouth 3 (three) times daily as needed for anxiety. 30 tablet 0    ondansetron  (ZOFRAN -ODT) 8 MG disintegrating tablet Take 1 tablet (8 mg total) by mouth every 8 (eight) hours as needed for nausea or vomiting. (Patient not taking: Reported on 07/20/2023) 20 tablet 0 Not Taking   predniSONE  (STERAPRED UNI-PAK 21 TAB) 10 MG (21) TBPK tablet Take by mouth daily. Take 6 tabs  by mouth daily  for 1 days, then 5 tabs for 1 days, then 4 tabs for 1 days, then 3 tabs for 1 days, 2 tabs for 1 days, then 1 tab by mouth daily for 1 days (Patient not taking: Reported on 07/20/2023) 21 tablet 0 Not Taking   promethazine -dextromethorphan (PROMETHAZINE -DM) 6.25-15 MG/5ML syrup Take 5 mLs by mouth 4 (four) times daily as needed for cough. (Patient not taking: Reported on 07/20/2023) 118 mL 0 Not Taking   sucralfate  (CARAFATE ) 1 g tablet Take 1 tablet (1 g total) by mouth 4 (four) times daily. (Patient not taking: Reported on 07/20/2023) 120  tablet 0 Not Taking   traZODone  (DESYREL ) 50 MG tablet Take 1 tablet (50 mg total) by mouth at bedtime as needed for sleep. (Patient not taking: Reported on 07/20/2023) 30 tablet 0 Not Taking   Scheduled:  pantoprazole  (PROTONIX ) IV  40 mg Intravenous Q12H   sucralfate   1 g Oral Q6H   Continuous:  lactated ringers  125 mL/hr at 07/21/23 9094   piperacillin -tazobactam (ZOSYN )  IV 3.375 g (07/21/23 0534)   PRN:acetaminophen  **OR** acetaminophen , HYDROmorphone  (DILAUDID ) injection, ondansetron  **OR** ondansetron  (ZOFRAN ) IV  Results for orders placed or performed during the hospital encounter of 07/20/23 (from the past 48 hours)  Comprehensive metabolic panel     Status: Abnormal   Collection Time: 07/20/23  4:45 PM  Result Value Ref Range   Sodium 137 135 - 145 mmol/L   Potassium 4.8 3.5 - 5.1 mmol/L    Comment: HEMOLYSIS AT THIS LEVEL MAY AFFECT RESULT   Chloride 103 98 - 111 mmol/L   CO2 20 (L) 22 - 32 mmol/L   Glucose, Bld 87 70 - 99 mg/dL    Comment: Glucose reference range applies only to samples taken after fasting for at least 8 hours.   BUN 10 6 - 20 mg/dL   Creatinine, Ser 9.13 0.61 - 1.24 mg/dL   Calcium 89.9 8.9 - 89.6 mg/dL   Total Protein 8.1 6.5 - 8.1 g/dL   Albumin 4.4 3.5 - 5.0 g/dL   AST 59 (H) 15 - 41 U/L    Comment: HEMOLYSIS AT THIS LEVEL MAY AFFECT RESULT   ALT 75 (H) 0 - 44 U/L    Comment: HEMOLYSIS AT THIS LEVEL MAY AFFECT RESULT   Alkaline Phosphatase 59 38 - 126 U/L   Total Bilirubin 1.3 (H) 0.0 - 1.2 mg/dL    Comment: HEMOLYSIS AT THIS LEVEL MAY AFFECT RESULT   GFR, Estimated >60 >60 mL/min    Comment: (NOTE) Calculated using the CKD-EPI Creatinine Equation (2021)    Anion gap 14 5 - 15    Comment: Performed at [facility], [address] [place], [place] [address]  Lipase, blood     Status: None   Collection Time: 07/20/23  4:45 PM  Result Value Ref Range   Lipase 27 11 - 51 U/L    Comment: HEMOLYSIS AT THIS LEVEL MAY AFFECT  RESULT Performed at [facility], [address] [place], [place] [address]   Type and screen [facility]     Status: None   Collection Time: 07/20/23  8:11 PM  Result Value Ref Range   ABO/RH(D) A POS    Antibody Screen NEG    Sample Expiration      07/23/2023,2359 Performed at [facility], [address], [place], [place] [address]   CBC with Differential/Platelet     Status: Abnormal   Collection  Time: 07/20/23  8:14 PM  Result Value Ref Range   WBC 13.6 (H) 4.0 - 10.5 K/uL   RBC 4.70 4.22 - 5.81 MIL/uL   Hemoglobin 14.5 13.0 - 17.0 g/dL   HCT 57.7 60.9 - 47.9 %   MCV 89.8 80.0 - 100.0 fL   MCH 30.9 26.0 - 34.0 pg   MCHC 34.4 30.0 - 36.0 g/dL   RDW 87.9 88.4 - 84.4 %   Platelets 340 150 - 400 K/uL   nRBC 0.0 0.0 - 0.2 %   Neutrophils Relative % 69 %   Neutro Abs 9.3 (H) 1.7 - 7.7 K/uL   Lymphocytes Relative 23 %   Lymphs Abs 3.2 0.7 - 4.0 K/uL   Monocytes Relative 6 %   Monocytes Absolute 0.8 0.1 - 1.0 K/uL   Eosinophils Relative 1 %   Eosinophils Absolute 0.2 0.0 - 0.5 K/uL   Basophils Relative 1 %   Basophils Absolute 0.1 0.0 - 0.1 K/uL   Immature Granulocytes 0 %   Abs Immature Granulocytes 0.06 0.00 - 0.07 K/uL    Comment: Performed at [facility], [address], [place], [place] [address]  Protime-INR     Status: None   Collection Time: 07/20/23  8:14 PM  Result Value Ref Range   Prothrombin Time 13.4 11.4 - 15.2 seconds   INR 1.0 0.8 - 1.2    Comment: (NOTE) INR goal varies based on device and disease states. Performed at [facility], [address] [place], [place] [address]   Lactic acid, plasma     Status: None   Collection Time: 07/20/23  8:14 PM  Result Value Ref Range   Lactic Acid, Venous 1.1 0.5 - 1.9 mmol/L    Comment: Performed at [facility], [address] [place], [place] [address]  ABO/Rh     Status: None   Collection Time: 07/20/23  8:20 PM  Result Value Ref Range   ABO/RH(D)      A  POS Performed at [facility], [address], [place], [place] [address]   POC occult blood, ED     Status: None   Collection Time: 07/20/23  8:49 PM  Result Value Ref Range   Fecal Occult Bld NEGATIVE NEGATIVE  Lactic acid, plasma     Status: None   Collection Time: 07/21/23  1:06 AM  Result Value Ref Range   Lactic Acid, Venous 0.9 0.5 - 1.9 mmol/L    Comment: Performed at [facility], [address], [place], [place] [address]  HIV Antibody (routine testing w rflx)     Status: None   Collection Time: 07/21/23  1:06 AM  Result Value Ref Range   HIV Screen 4th Generation wRfx Non Reactive Non Reactive    Comment: Performed at [facility], [address], [place], [place] [address]  CBC     Status: None   Collection Time: 07/21/23  2:25 AM  Result Value Ref Range   WBC 10.1 4.0 - 10.5 K/uL   RBC 4.61 4.22 - 5.81 MIL/uL   Hemoglobin 14.1 13.0 - 17.0 g/dL   HCT 58.7 60.9 - 47.9 %   MCV 89.4 80.0 - 100.0 fL   MCH 30.6 26.0 - 34.0 pg   MCHC 34.2 30.0 - 36.0 g/dL   RDW 87.9 88.4 - 84.4 %   Platelets 293 150 - 400 K/uL   nRBC 0.0 0.0 - 0.2 %  Comment: Performed at [facility], [address], [place], [place] [address]  Basic metabolic panel     Status: None   Collection Time: 07/21/23  2:25 AM  Result Value Ref Range   Sodium 137 135 - 145 mmol/L   Potassium 3.8 3.5 - 5.1 mmol/L   Chloride 104 98 - 111 mmol/L   CO2 23 22 - 32 mmol/L   Glucose, Bld 93 70 - 99 mg/dL    Comment: Glucose reference range applies only to samples taken after fasting for at least 8 hours.   BUN 9 6 - 20 mg/dL   Creatinine, Ser 9.15 0.61 - 1.24 mg/dL   Calcium 9.1 8.9 - 89.6 mg/dL   GFR, Estimated >39 >39 mL/min    Comment: (NOTE) Calculated using the CKD-EPI Creatinine Equation (2021)    Anion gap 10 5 - 15    Comment: Performed at [facility], [address], [place], [place] [address]    CT ABDOMEN PELVIS W CONTRAST Result Date: 07/20/2023 CLINICAL DATA:   Abdominal pain. Recent diagnosis of duodenitis. Abdominal pain and dark coffee-ground stools and acid reflux EXAM: CT ABDOMEN AND PELVIS WITH CONTRAST TECHNIQUE: Multidetector CT imaging of the abdomen and pelvis was performed using the standard protocol following bolus administration of intravenous contrast. RADIATION DOSE REDUCTION: This exam was performed according to the departmental dose-optimization program which includes automated exposure control, adjustment of the mA and/or kV according to patient size and/or use of iterative reconstruction technique. CONTRAST:  75mL OMNIPAQUE  IOHEXOL  350 MG/ML SOLN COMPARISON:  07/01/2023 FINDINGS: Lower chest: No acute abnormality. Hepatobiliary: Unremarkable liver. Normal gallbladder. No biliary dilation. Pancreas: Unremarkable. Spleen: Unremarkable. Adrenals/Urinary Tract: Normal adrenal glands. No urinary calculi or hydronephrosis. Bladder is unremarkable. Stomach/Bowel: Stomach is within normal limits. Mild wall thickening and adjacent stranding about the proximal duodenum. There is a new outpouching of fluid along the medial wall of the duodenum measuring 1.6 x 1.6 cm (series 3/image 28 and 6/95). This is compatible with a duodenal ulcer. No free intraperitoneal air to suggest perforation. Vascular/Lymphatic: Normal caliber aorta. 8 mm lymph node inferior to the inflamed duodenum is similar to prior (series 6/image 101). Reproductive: Unremarkable. Other: No abdominal wall hernia. Musculoskeletal: No acute fracture. IMPRESSION: Acute duodenitis with new 1.6 cm duodenal ulcer. No free intraperitoneal air to suggest perforation. Electronically Signed   By: Norman Gatlin M.D.   On: 07/20/2023 19:37   Review of Systems  Constitutional:  Negative for activity change, appetite change, chills, diaphoresis, fatigue, fever and unexpected weight change.  HENT: Negative.    Eyes: Negative.   Respiratory: Negative.    Cardiovascular: Negative.   Gastrointestinal:   Positive for abdominal pain. Negative for anal bleeding, blood in stool, constipation, diarrhea, nausea, rectal pain and vomiting.  Musculoskeletal: Negative.   Skin: Negative.   Allergic/Immunologic: Negative.   Neurological: Negative.   Hematological: Negative.   Psychiatric/Behavioral:  The patient is nervous/anxious.    Blood pressure (!) 152/98, pulse (!) 54, temperature 97.8 F (36.6 C), temperature source Oral, resp. rate 18, height 6' 2 (1.88 m), weight 111.1 kg, SpO2 99%. Physical Exam Constitutional:      General: He is not in acute distress.    Appearance: He is well-developed. He is not ill-appearing.  HENT:     Head: Normocephalic and atraumatic.  Eyes:     Extraocular Movements: Extraocular movements intact.     Pupils: Pupils are equal, round, and reactive to light.  Cardiovascular:  Rate and Rhythm: Normal rate and regular rhythm.     Heart sounds: Normal heart sounds.  Pulmonary:     Effort: Pulmonary effort is normal.     Breath sounds: Normal breath sounds.  Abdominal:     General: Abdomen is flat.     Tenderness: There is abdominal tenderness in the epigastric area.  Skin:    General: Skin is warm and dry.  Neurological:     General: No focal deficit present.     Mental Status: He is alert and oriented to person, place, and time.  Psychiatric:        Mood and Affect: Mood normal.        Behavior: Behavior normal.   Assessment/Plan: 1)  Epigastric pain with a duodenal ulcer noted on CT scan-we will schedule the patient for an EGD tomorrow. His hemoglobin is stable at 14.1 g/dl-patient has been strongly counseled against use of all nonsteroidals. 2) History of depression. 3) History of tobacco and marijuana use. 4) Obesity.  Renaye Sous 07/21/2023, 9:42 AM

## 2023-07-21 NOTE — Subjective & Objective (Addendum)
 Pt seen and examined with general surgery. EGD today shows Grade C esophagitis and non-bleeding duodenal ulcer Cleared for DC by GI

## 2023-07-21 NOTE — Assessment & Plan Note (Signed)
BMI 31.46

## 2023-07-21 NOTE — Plan of Care (Signed)

## 2023-07-21 NOTE — Assessment & Plan Note (Signed)
 07-21-2023 due to duodenal ulcer. 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

## 2023-07-22 ENCOUNTER — Encounter (HOSPITAL_COMMUNITY): Payer: Self-pay | Admitting: Internal Medicine

## 2023-07-22 ENCOUNTER — Inpatient Hospital Stay (HOSPITAL_COMMUNITY): Payer: BC Managed Care – PPO | Admitting: Anesthesiology

## 2023-07-22 ENCOUNTER — Encounter (HOSPITAL_COMMUNITY)
Admission: EM | Disposition: A | Discharge status: Home / Self Care | Payer: Self-pay | Source: Home / Self Care | Attending: Internal Medicine

## 2023-07-22 DIAGNOSIS — K2100 Gastro-esophageal reflux disease with esophagitis, without bleeding: Secondary | ICD-10-CM | POA: Diagnosis not present

## 2023-07-22 DIAGNOSIS — K2970 Gastritis, unspecified, without bleeding: Secondary | ICD-10-CM

## 2023-07-22 DIAGNOSIS — K208 Other esophagitis without bleeding: Secondary | ICD-10-CM

## 2023-07-22 DIAGNOSIS — E66811 Obesity, class 1: Secondary | ICD-10-CM

## 2023-07-22 DIAGNOSIS — K449 Diaphragmatic hernia without obstruction or gangrene: Secondary | ICD-10-CM

## 2023-07-22 DIAGNOSIS — K2980 Duodenitis without bleeding: Secondary | ICD-10-CM | POA: Diagnosis not present

## 2023-07-22 DIAGNOSIS — K269 Duodenal ulcer, unspecified as acute or chronic, without hemorrhage or perforation: Secondary | ICD-10-CM | POA: Diagnosis not present

## 2023-07-22 HISTORY — PX: ESOPHAGOGASTRODUODENOSCOPY: SHX5428

## 2023-07-22 HISTORY — PX: BIOPSY: SHX5522

## 2023-07-22 SURGERY — EGD (ESOPHAGOGASTRODUODENOSCOPY)
Anesthesia: Monitor Anesthesia Care

## 2023-07-22 MED ORDER — PROPOFOL 10 MG/ML IV BOLUS
INTRAVENOUS | Status: DC | PRN
  Administered 2023-07-22: 100 mg via INTRAVENOUS
  Administered 2023-07-22: 50 mg via INTRAVENOUS
  Administered 2023-07-22 (×3): 100 mg via INTRAVENOUS
  Administered 2023-07-22: 50 mg via INTRAVENOUS

## 2023-07-22 MED ORDER — SUCRALFATE 1 G PO TABS
1.0000 g | ORAL_TABLET | Freq: Four times a day (QID) | ORAL | Status: DC
  Administered 2023-07-22: 1 g via ORAL
  Filled 2023-07-22: qty 1

## 2023-07-22 MED ORDER — PANTOPRAZOLE SODIUM 40 MG PO TBEC: 40.0000 mg | DELAYED_RELEASE_TABLET | Freq: Two times a day (BID) | ORAL | 0 refills | Status: DC

## 2023-07-22 MED ORDER — SUCRALFATE 1 G PO TABS: 1.0000 g | ORAL_TABLET | Freq: Four times a day (QID) | ORAL | 0 refills | Status: AC

## 2023-07-22 MED ORDER — SODIUM CHLORIDE 0.9 % IV SOLN
INTRAVENOUS | Status: DC | PRN
  Administered 2023-07-22: 500 mL via INTRAVENOUS

## 2023-07-22 MED ORDER — ONDANSETRON HCL 4 MG PO TABS: 4.0000 mg | ORAL_TABLET | Freq: Four times a day (QID) | ORAL | 0 refills | Status: DC | PRN

## 2023-07-22 NOTE — Anesthesia Postprocedure Evaluation (Signed)
 Anesthesia Post Note  Patient: Bernard Benton  Procedure(s) Performed: ESOPHAGOGASTRODUODENOSCOPY (EGD) BIOPSY     Patient location during evaluation: PACU Anesthesia Type: MAC Level of consciousness: awake and alert Pain management: pain level controlled Vital Signs Assessment: post-procedure vital signs reviewed and stable Respiratory status: spontaneous breathing, nonlabored ventilation, respiratory function stable and patient connected to nasal cannula oxygen Cardiovascular status: stable and blood pressure returned to baseline Postop Assessment: no apparent nausea or vomiting Anesthetic complications: no   No notable events documented.  Last Vitals:  Vitals:   07/22/23 0910 07/22/23 0920  BP: (!) 138/98 (!) 136/96  Pulse: (!) 56 (!) 48  Resp: 13 (!) 9  Temp:    SpO2: 98% 98%    Last Pain:  Vitals:   07/22/23 0920  TempSrc:   PainSc: 0-No pain                 [name]

## 2023-07-22 NOTE — Op Note (Addendum)
 [facility] Patient Name: Bernard Benton Procedure Date : 07/22/2023 MRN: [id number] Attending MD: Gustav ALONSO Mcgee , MD, [id number] Date of Birth: 10/04/1995 CSN: [id number] Age: 28 Admit Type: Inpatient Procedure:                Upper GI endoscopy Indications:              Upper abdominal symptoms that persist despite an                            appropriate trial of therapy, Suspected duodenal                            ulcer, Abnormal CT of the GI tract Providers:                Gustav ALONSO Mcgee, MD, Heather Ng, RN, Corky Czech, Technician Referring MD:              Medicines:                Monitored Anesthesia Care Complications:            No immediate complications. Estimated Blood Loss:     Estimated blood loss was minimal. Procedure:                Pre-Anesthesia Assessment:                           - Prior to the procedure, a History and Physical                            was performed, and patient medications and                            allergies were reviewed. The patient's tolerance of                            previous anesthesia was also reviewed. The risks                            and benefits of the procedure and the sedation                            options and risks were discussed with the patient.                            All questions were answered, and informed consent                            was obtained. Prior Anticoagulants: The patient has                            taken no anticoagulant or antiplatelet agents. ASA  Grade Assessment: II - A patient with mild systemic                            disease. After reviewing the risks and benefits,                            the patient was deemed in satisfactory condition to                            undergo the procedure.                           After obtaining informed consent, the endoscope was                             passed under direct vision. Throughout the                            procedure, the patient's blood pressure, pulse, and                            oxygen saturations were monitored continuously. The                            [id number]-H190 ([id number]) Olympus endoscope was introduced                            through the mouth, and advanced to the third part                            of duodenum. The upper GI endoscopy was                            accomplished without difficulty. The patient                            tolerated the procedure well. Scope In: Scope Out: Findings:      LA Grade C (one or more mucosal breaks continuous between tops of 2 or       more mucosal folds, less than 75% circumference) esophagitis with no       bleeding was found 32 to 40 cm from the incisors.      A 2 cm hiatal hernia was present.      Patchy mild inflammation characterized by congestion (edema), erosions,       erythema and friability was found in the cardia, in the gastric body, in       the gastric antrum and in the prepyloric region of the stomach. Biopsies       were taken with a cold forceps for Helicobacter pylori testing.      The cardia and gastric fundus were normal on retroflexion.      One non-bleeding cratered duodenal ulcer with no stigmata of bleeding       was found in the duodenal bulb. The lesion was 15 mm in largest       dimension. Biopsies were taken  with a cold forceps from raised ulcer       edge for histology.      The second portion of the duodenum and third portion of the duodenum       were normal. Impression:               - LA Grade C reflux esophagitis with no bleeding.                           - 2 cm hiatal hernia.                           - Gastritis. Biopsied.                           - Non-bleeding duodenal ulcer with no stigmata of                            bleeding. Biopsied ulcer edge.                           - Normal second portion of the duodenum  and third                            portion of the duodenum. Recommendation:           - Resume previous diet.                           - Continue present medications.                           - Await pathology results.                           - Follow an antireflux regimen.                           - Use Protonix  (pantoprazole ) 40 mg IV BID during                            hospitalization and then transition to PO BID on                            discharge, continue for additional 6 months.                           - Use sucralfate  tablets 1 gram PO QID for 2 weeks.                           - No aspirin, ibuprofen, naproxen, or other                            non-steroidal anti-inflammatory drugs.                           - Discussed with patient to abstain from alcohol  and Marijuana or any recreational drugs                           - GI team is signing off but is available if                            needed, please call with any questions Procedure Code(s):        --- Professional ---                           [phone], Esophagogastroduodenoscopy, flexible,                            transoral; with biopsy, single or multiple Diagnosis Code(s):        --- Professional ---                           K21.00, Gastro-esophageal reflux disease with                            esophagitis, without bleeding                           K44.9, Diaphragmatic hernia without obstruction or                            gangrene                           K29.70, Gastritis, unspecified, without bleeding                           K26.9, Duodenal ulcer, unspecified as acute or                            chronic, without hemorrhage or perforation                           R19.8, Other specified symptoms and signs involving                            the digestive system and abdomen                           R93.3, Abnormal findings on diagnostic imaging of                             other parts of digestive tract CPT copyright 2022 American Medical Association. All rights reserved. The codes documented in this report are preliminary and upon coder review may  be revised to meet current compliance requirements. Jonnie Truxillo V. Edgardo Petrenko, MD 07/22/2023 9:01:17 AM This report has been signed electronically. Number of Addenda: 0

## 2023-07-22 NOTE — Anesthesia Preprocedure Evaluation (Addendum)
 Anesthesia Evaluation  Patient identified by MRN, date of birth, ID band Patient awake    Reviewed: Allergy & Precautions, NPO status , Patient's Chart, lab work & pertinent test results, reviewed documented beta blocker date and time   History of Anesthesia Complications Negative for: history of anesthetic complications  Airway Mallampati: II  TM Distance: >3 FB     Dental no notable dental hx.    Pulmonary neg COPD, former smoker THC   breath sounds clear to auscultation       Cardiovascular (-) hypertension(-) angina (-) CAD and (-) Past MI  Rhythm:Regular Rate:Normal     Neuro/Psych  PSYCHIATRIC DISORDERS Anxiety Depression       GI/Hepatic PUD,GERD  ,,(+) neg Cirrhosis        Endo/Other    Renal/GU Renal disease     Musculoskeletal   Abdominal   Peds  Hematology   Anesthesia Other Findings   Reproductive/Obstetrics                             Anesthesia Physical Anesthesia Plan  ASA: 2  Anesthesia Plan: MAC   Post-op Pain Management:    Induction: Intravenous  PONV Risk Score and Plan: 1 and Ondansetron   Airway Management Planned: Natural Airway and Nasal Cannula  Additional Equipment:   Intra-op Plan:   Post-operative Plan:   Informed Consent: I have reviewed the patients History and Physical, chart, labs and discussed the procedure including the risks, benefits and alternatives for the proposed anesthesia with the patient or authorized representative who has indicated his/her understanding and acceptance.     Dental advisory given  Plan Discussed with: CRNA  Anesthesia Plan Comments:         Anesthesia Quick Evaluation

## 2023-07-22 NOTE — Assessment & Plan Note (Signed)
 DC to home with bid protonix 40 mg x 6 weeks.

## 2023-07-22 NOTE — Progress Notes (Signed)
 EGD results noted.  No need for surgery.  Agree with treatment plan recommended by GI.  We will sign off.  Bernard Benton 9:51 AM 07/22/2023

## 2023-07-22 NOTE — Interval H&P Note (Signed)
 History and Physical Interval Note:  07/22/2023 8:31 AM  Bernard Benton  has presented today for surgery, with the diagnosis of Duodenal ulcer on CT scan/epigastric pain.  The various methods of treatment have been discussed with the patient and family. After consideration of risks, benefits and other options for treatment, the patient has consented to  Procedure(s): ESOPHAGOGASTRODUODENOSCOPY (EGD) (N/A) as a surgical intervention.  The patient's history has been reviewed, patient examined, no change in status, stable for surgery.  I have reviewed the patient's chart and labs.  Questions were answered to the patient's satisfaction.     [name]

## 2023-07-22 NOTE — Transfer of Care (Signed)
 Immediate Anesthesia Transfer of Care Note  Patient: Bernard Benton  Procedure(s) Performed: ESOPHAGOGASTRODUODENOSCOPY (EGD) BIOPSY  Patient Location: Endoscopy Unit  Anesthesia Type:MAC  Level of Consciousness: awake, drowsy, and patient cooperative  Airway & Oxygen Therapy: Patient Spontanous Breathing  Post-op Assessment: Report given to RN and Post -op Vital signs reviewed and stable  Post vital signs: Reviewed and stable  Last Vitals:  Vitals Value Taken Time  BP 144/97 07/22/23 0857  Temp    Pulse 72 07/22/23 0858  Resp 14 07/22/23 0858  SpO2 99 % 07/22/23 0858  Vitals shown include unfiled device data.  Last Pain:  Vitals:   07/22/23 0745  TempSrc: Temporal  PainSc: 4       Patients Stated Pain Goal: 0 (07/21/23 1627)  Complications: No notable events documented.

## 2023-07-22 NOTE — Progress Notes (Signed)
 PROGRESS NOTE    Bernard Benton  FMW:[id number] DOB: Jul 29, 1995 DOA: 07/20/2023 PCP: Pcp, No  Subjective: Pt seen and examined with general surgery. EGD today shows Grade C esophagitis and non-bleeding duodenal ulcer Cleared for DC by GI    Hospital Course: HPI: Bernard Benton is a 28 y.o. male with medical history significant of depression, THC use, GERD.   Pt in to ED with c/o abdominal pain.  Pt diagnosed with duodenitis at [facility] ED on 12/16.  No ulcer seen on CT scan at that time though had a lymph node noted.  Got started on empiric ABx for H.Pylori.   Unfortunately due to ongoing pain has been taking NSAIDS fairly religiously over the past couple of weeks.   Pt in to ED today with ongoing abd pain, N/V, 10-15 dark colored stools per day.  Significant Events: Admitted 07/20/2023 for duodenal ulcer and UGI bleeding   Significant Labs: WBC 13.6, HgB 9.3, BUN 10, Scr 0.86 AST 59, ALT 75, T. Bili 1.3  Significant Imaging Studies: Ct abd/pelvis shows Acute duodenitis with new 1.6 cm duodenal ulcer. No free intraperitoneal air to suggest perforation   Antibiotic Therapy: Anti-infectives (From admission, onward)    Start     Dose/Rate Route Frequency Ordered Stop   07/20/23 2230  piperacillin -tazobactam (ZOSYN ) IVPB 3.375 g        3.375 g 12.5 mL/hr over 240 Minutes Intravenous Every 8 hours 07/20/23 2222         Procedures:   Consultants: GI General surgery    Assessment and Plan: * Duodenal ulcer On Admission, Duodenal ulcer, not yet perforated but with outpouching and looks like its pending perf.  New / progressed since duodenitis diagnosis in ED at [facility] 07/01/23.  At risk for perforation. May or may not have H. Pylori, looks like someone had him on empiric treatment as outpt for H.Pylori these past couple of weeks. IV PPI at maximum labeled dose NPO except sips with carafate  Carafate  PO Q6H Spoke with Dr. Stevie (gen surg): Add empiric ABx: Zosyn  Call  GI in AM, though not sure if they will or wont want to do EGD given the outpouching and near perforation appearance of the ulcer on CT scan. Despite ulcer presence: hemoccult negative and no anemia (HGB 14.5), doesn't seem to have significant GIB with this Dilaudid  prn pain Zofran  prn nausea Advised pt: no more NSAIDS  07-21-2023 GI and general surgery consulted. GI plans on EGD tomorrow. Continue with protonix  IV bid. NPO after MN. I see no need for IV abx at this point.  07-22-2023 EGD today shows Grade C esophagitis and non-bleeding duodenal ulcer. DC to home with 6 weeks of bid protonix  40 mg and 2 weeks of QID carafate  1 gram  Upper GI bleeding 07-21-2023 due to duodenal ulcer. 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  Mood disorder (HCC) Stable. Only on prn hydroxyzine  for anxiety.  [place] grade C esophagitis DC to home with bid protonix  40 mg x 6 weeks.  Obesity, Class I, BMI 30-34.9 BMI 31.46       DVT prophylaxis: SCDs Start: 07/20/23 2237     Code Status: Full Code Family Communication: no family at bedside. Pt is decisional. Disposition Plan: return home Reason for continuing need for hospitalization: stable for DC.  Objective: Vitals:   07/22/23 0858 07/22/23 0910 07/22/23 0920 07/22/23 1029  BP: (!) 144/97 (!) 138/98 (!) 136/96 134/84  Pulse: 72 (!) 56 (!) 48 61  Resp: 14  13 (!) 9 17  Temp:    98.2 F (36.8 C)  TempSrc:    Oral  SpO2: 99% 98% 98% 99%  Weight:      Height:        Intake/Output Summary (Last 24 hours) at 07/22/2023 1213 Last data filed at 07/22/2023 0855 Gross per 24 hour  Intake 300 ml  Output --  Net 300 ml   Filed Weights   07/20/23 1625  Weight: 111.1 kg    Examination:  Physical Exam Vitals and nursing note reviewed.  Constitutional:      General: He is not in acute distress.    Appearance: He is not toxic-appearing or diaphoretic.  HENT:     Head: Normocephalic and atraumatic.  Pulmonary:     Effort: Pulmonary  effort is normal. No respiratory distress.  Neurological:     General: No focal deficit present.     Mental Status: He is alert and oriented to person, place, and time.     Data Reviewed: I have personally reviewed following labs and imaging studies  CBC: Recent Labs  Lab 07/20/23 2014 07/21/23 0225  WBC 13.6* 10.1  NEUTROABS 9.3*  --   HGB 14.5 14.1  HCT 42.2 41.2  MCV 89.8 89.4  PLT 340 293   Basic Metabolic Panel: Recent Labs  Lab 07/20/23 1645 07/21/23 0225  NA 137 137  K 4.8 3.8  CL 103 104  CO2 20* 23  GLUCOSE 87 93  BUN 10 9  CREATININE 0.86 0.84  CALCIUM 10.0 9.1   GFR: Estimated Creatinine Clearance: 173.7 mL/min (by C-G formula based on SCr of 0.84 mg/dL). Liver Function Tests: Recent Labs  Lab 07/20/23 1645  AST 59*  ALT 75*  ALKPHOS 59  BILITOT 1.3*  PROT 8.1  ALBUMIN 4.4   Recent Labs  Lab 07/20/23 1645  LIPASE 27    Coagulation Profile: Recent Labs  Lab 07/20/23 2014  INR 1.0   Anemia Panel: Recent Labs    07/21/23 0106  TIBC 357  IRON 122   Sepsis Labs: Recent Labs  Lab 07/20/23 2014 07/21/23 0106  LATICACIDVEN 1.1 0.9    Radiology Studies: CT ABDOMEN PELVIS W CONTRAST Result Date: 07/20/2023 CLINICAL DATA:  Abdominal pain. Recent diagnosis of duodenitis. Abdominal pain and dark coffee-ground stools and acid reflux EXAM: CT ABDOMEN AND PELVIS WITH CONTRAST TECHNIQUE: Multidetector CT imaging of the abdomen and pelvis was performed using the standard protocol following bolus administration of intravenous contrast. RADIATION DOSE REDUCTION: This exam was performed according to the departmental dose-optimization program which includes automated exposure control, adjustment of the mA and/or kV according to patient size and/or use of iterative reconstruction technique. CONTRAST:  75mL OMNIPAQUE  IOHEXOL  350 MG/ML SOLN COMPARISON:  07/01/2023 FINDINGS: Lower chest: No acute abnormality. Hepatobiliary: Unremarkable liver. Normal  gallbladder. No biliary dilation. Pancreas: Unremarkable. Spleen: Unremarkable. Adrenals/Urinary Tract: Normal adrenal glands. No urinary calculi or hydronephrosis. Bladder is unremarkable. Stomach/Bowel: Stomach is within normal limits. Mild wall thickening and adjacent stranding about the proximal duodenum. There is a new outpouching of fluid along the medial wall of the duodenum measuring 1.6 x 1.6 cm (series 3/image 28 and 6/95). This is compatible with a duodenal ulcer. No free intraperitoneal air to suggest perforation. Vascular/Lymphatic: Normal caliber aorta. 8 mm lymph node inferior to the inflamed duodenum is similar to prior (series 6/image 101). Reproductive: Unremarkable. Other: No abdominal wall hernia. Musculoskeletal: No acute fracture. IMPRESSION: Acute duodenitis with new 1.6 cm duodenal ulcer. No free intraperitoneal  air to suggest perforation. Electronically Signed   By: Norman Gatlin M.D.   On: 07/20/2023 19:37    Scheduled Meds:  pantoprazole  (PROTONIX ) IV  40 mg Intravenous Q12H   sucralfate   1 g Oral QID   Continuous Infusions:   LOS: 1 day   Time spent: 40 minutes  Camellia Door, DO  [facility]  07/22/2023, 12:13 PM

## 2023-07-22 NOTE — Discharge Summary (Signed)
 [facility] Physician Discharge Summary   Patient name: Bernard Benton  Admit date:     07/20/2023  Discharge date: 07/22/2023  Attending Physician: LAURENCE Naylene Foell [[id number]]  Discharge Physician: Camellia Laurence   PCP: Pcp, No  Admitted From: Home  Disposition:  Home  Recommendations for Outpatient Follow-up:  Follow up with  GI in 4 weeks Remain off NSAIDs until seen by GI Please follow up on the following pending results: gastric biopsy  Home Health:No Equipment/Devices: None  Discharge Condition:Stable CODE STATUS:FULL Diet recommendation: Regular Fluid Restriction: None  Hospital Summary: HPI: [name] is a 28 y.o. male with medical history significant of depression, THC use, GERD.   Pt in to ED with c/o abdominal pain.  Pt diagnosed with duodenitis at [facility] ED on 12/16.  No ulcer seen on CT scan at that time though had a lymph node noted.  Got started on empiric ABx for H.Pylori.   Unfortunately due to ongoing pain has been taking NSAIDS fairly religiously over the past couple of weeks.   Pt in to ED today with ongoing abd pain, N/V, 10-15 dark colored stools per day.  Significant Events: Admitted 07/20/2023 for duodenal ulcer and UGI bleeding   Significant Labs: WBC 13.6, HgB 9.3, BUN 10, Scr 0.86 AST 59, ALT 75, T. Bili 1.3  Significant Imaging Studies: Ct abd/pelvis shows Acute duodenitis with new 1.6 cm duodenal ulcer. No free intraperitoneal air to suggest perforation   Antibiotic Therapy: Anti-infectives (From admission, onward)    Start     Dose/Rate Route Frequency Ordered Stop   07/20/23 2230  piperacillin -tazobactam (ZOSYN ) IVPB 3.375 g        3.375 g 12.5 mL/hr over 240 Minutes Intravenous Every 8 hours 07/20/23 2222         Procedures: 07-22-2023 EGD showed Grade C esophagitis and non-bleeding duodenal ulcer.   Consultants: GI General surgery   Hospital Course by Problem: * Duodenal ulcer On Admission, Duodenal ulcer, not yet  perforated but with outpouching and looks like its pending perf.  New / progressed since duodenitis diagnosis in ED at [facility] 07/01/23.  At risk for perforation. May or may not have H. Pylori, looks like someone had him on empiric treatment as outpt for H.Pylori these past couple of weeks. IV PPI at maximum labeled dose NPO except sips with carafate  Carafate  PO Q6H Spoke with Dr. Stevie (gen surg): Add empiric ABx: Zosyn  Call GI in AM, though not sure if they will or wont want to do EGD given the outpouching and near perforation appearance of the ulcer on CT scan. Despite ulcer presence: hemoccult negative and no anemia (HGB 14.5), doesn't seem to have significant GIB with this Dilaudid  prn pain Zofran  prn nausea Advised pt: no more NSAIDS  07-21-2023 GI and general surgery consulted. GI plans on EGD tomorrow. Continue with protonix  IV bid. NPO after MN. I see no need for IV abx at this point.  07-22-2023 EGD today shows Grade C esophagitis and non-bleeding duodenal ulcer. DC to home with 6 weeks of bid protonix  40 mg and 2 weeks of QID carafate  1 gram  Upper GI bleeding 07-21-2023 due to duodenal ulcer. 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  Mood disorder (HCC) Stable. Only on prn hydroxyzine  for anxiety.  [place] grade C esophagitis DC to home with bid protonix  40 mg x 6 weeks.  Obesity, Class I, BMI 30-34.9 BMI 31.46    Discharge Diagnoses:  Principal Problem:   Duodenal ulcer Active  Problems:   Upper GI bleeding   Mood disorder (HCC)   Obesity, Class I, BMI 30-34.9   [place] grade C esophagitis   Discharge Instructions  Discharge Instructions     Call MD for:  difficulty breathing, headache or visual disturbances   Complete by: As directed    Call MD for:  extreme fatigue   Complete by: As directed    Call MD for:  hives   Complete by: As directed    Call MD for:  persistant dizziness or light-headedness   Complete by: As directed    Call MD for:   persistant nausea and vomiting   Complete by: As directed    Call MD for:  redness, tenderness, or signs of infection (pain, swelling, redness, odor or green/yellow discharge around incision site)   Complete by: As directed    Call MD for:  severe uncontrolled pain   Complete by: As directed    Call MD for:  temperature >100.4   Complete by: As directed    Diet - low sodium heart healthy   Complete by: As directed    Discharge instructions   Complete by: As directed    1. Follow up with [facility] in 4-6 weeks. Call office for appointment. [phone] 2. Do not take any aspirin, ibuprofen, naproxen or other medications listed the NSAIDs class of medication. You may take tylenol  as needed for aches/pain/headache.  If you are unsure if a pain medication is a NSAIDs, please ask your local pharmacist. 3. Avoid etoh and other recreational drugs while your duodenal ulcer is healing.   Increase activity slowly   Complete by: As directed       Allergies as of 07/22/2023       Reactions   Other    Pt reports allergic to an IV ABX but does not remember which one   Morphine  Rash   Local rash        Medication List     TAKE these medications    hydrOXYzine  25 MG tablet Commonly known as: ATARAX  Take 1 tablet (25 mg total) by mouth 3 (three) times daily as needed for anxiety.   nicotine  polacrilex 4 MG gum Commonly known as: NICORETTE  Take 1 each (4 mg total) by mouth as needed for smoking cessation.   ondansetron  4 MG tablet Commonly known as: ZOFRAN  Take 1 tablet (4 mg total) by mouth every 6 (six) hours as needed for nausea or vomiting.   pantoprazole  40 MG tablet Commonly known as: Protonix  Take 1 tablet (40 mg total) by mouth 2 (two) times daily.   sucralfate  1 g tablet Commonly known as: CARAFATE  Take 1 tablet (1 g total) by mouth 4 (four) times daily for 14 days.        Allergies  Allergen Reactions   Other     Pt reports allergic to an IV ABX but does not  remember which one   Morphine  Rash    Local rash     Discharge Exam: Vitals:   07/22/23 0920 07/22/23 1029  BP: (!) 136/96 134/84  Pulse: (!) 48 61  Resp: (!) 9 17  Temp:  98.2 F (36.8 C)  SpO2: 98% 99%    Physical Exam Vitals and nursing note reviewed.  Constitutional:      General: He is not in acute distress.    Appearance: He is not toxic-appearing or diaphoretic.  HENT:     Head: Normocephalic and atraumatic.  Pulmonary:  Effort: Pulmonary effort is normal. No respiratory distress.  Neurological:     General: No focal deficit present.     Mental Status: He is alert and oriented to person, place, and time.     The results of significant diagnostics from this hospitalization (including imaging, microbiology, ancillary and laboratory) are listed below for reference.    Labs:  Basic Metabolic Panel: Recent Labs  Lab 07/20/23 1645 07/21/23 0225  NA 137 137  K 4.8 3.8  CL 103 104  CO2 20* 23  GLUCOSE 87 93  BUN 10 9  CREATININE 0.86 0.84  CALCIUM 10.0 9.1   Liver Function Tests: Recent Labs  Lab 07/20/23 1645  AST 59*  ALT 75*  ALKPHOS 59  BILITOT 1.3*  PROT 8.1  ALBUMIN 4.4   Recent Labs  Lab 07/20/23 1645  LIPASE 27    CBC: Recent Labs  Lab 07/20/23 2014 07/21/23 0225  WBC 13.6* 10.1  NEUTROABS 9.3*  --   HGB 14.5 14.1  HCT 42.2 41.2  MCV 89.8 89.4  PLT 340 293   Anemia work up Recent Labs    07/21/23 0106  TIBC 357  IRON 122   Urinalysis    Component Value Date/Time   COLORURINE YELLOW (A) 10/04/2020 0622   APPEARANCEUR CLEAR (A) 10/04/2020 0622   LABSPEC >1.046 (H) 10/04/2020 0622   PHURINE 7.0 10/04/2020 0622   GLUCOSEU 150 (A) 10/04/2020 0622   HGBUR NEGATIVE 10/04/2020 0622   BILIRUBINUR NEGATIVE 10/04/2020 0622   KETONESUR 80 (A) 10/04/2020 0622   PROTEINUR NEGATIVE 10/04/2020 0622   NITRITE NEGATIVE 10/04/2020 0622   LEUKOCYTESUR NEGATIVE 10/04/2020 0622   Sepsis Labs Recent Labs  Lab 07/20/23 2014  07/21/23 0225  WBC 13.6* 10.1    Procedures/Studies: CT ABDOMEN PELVIS W CONTRAST Result Date: 07/20/2023 CLINICAL DATA:  Abdominal pain. Recent diagnosis of duodenitis. Abdominal pain and dark coffee-ground stools and acid reflux EXAM: CT ABDOMEN AND PELVIS WITH CONTRAST TECHNIQUE: Multidetector CT imaging of the abdomen and pelvis was performed using the standard protocol following bolus administration of intravenous contrast. RADIATION DOSE REDUCTION: This exam was performed according to the departmental dose-optimization program which includes automated exposure control, adjustment of the mA and/or kV according to patient size and/or use of iterative reconstruction technique. CONTRAST:  75mL OMNIPAQUE  IOHEXOL  350 MG/ML SOLN COMPARISON:  07/01/2023 FINDINGS: Lower chest: No acute abnormality. Hepatobiliary: Unremarkable liver. Normal gallbladder. No biliary dilation. Pancreas: Unremarkable. Spleen: Unremarkable. Adrenals/Urinary Tract: Normal adrenal glands. No urinary calculi or hydronephrosis. Bladder is unremarkable. Stomach/Bowel: Stomach is within normal limits. Mild wall thickening and adjacent stranding about the proximal duodenum. There is a new outpouching of fluid along the medial wall of the duodenum measuring 1.6 x 1.6 cm (series 3/image 28 and 6/95). This is compatible with a duodenal ulcer. No free intraperitoneal air to suggest perforation. Vascular/Lymphatic: Normal caliber aorta. 8 mm lymph node inferior to the inflamed duodenum is similar to prior (series 6/image 101). Reproductive: Unremarkable. Other: No abdominal wall hernia. Musculoskeletal: No acute fracture. IMPRESSION: Acute duodenitis with new 1.6 cm duodenal ulcer. No free intraperitoneal air to suggest perforation. Electronically Signed   By: Norman Gatlin M.D.   On: 07/20/2023 19:37   CT Renal Stone Study Result Date: 07/01/2023 CLINICAL DATA:  Flank pain going on a week. EXAM: CT ABDOMEN AND PELVIS WITHOUT CONTRAST  TECHNIQUE: Multidetector CT imaging of the abdomen and pelvis was performed following the standard protocol without IV contrast. RADIATION DOSE REDUCTION: This exam was performed according  to the departmental dose-optimization program which includes automated exposure control, adjustment of the mA and/or kV according to patient size and/or use of iterative reconstruction technique. COMPARISON:  Contrast CT 10/04/2020. FINDINGS: Lower chest: There is some linear opacity lung bases likely scar or atelectasis. No pleural effusion. Breathing motion at the bases. Hepatobiliary: No focal liver abnormality is seen. No gallstones, gallbladder wall thickening, or biliary dilatation. Pancreas: Unremarkable. No pancreatic ductal dilatation or surrounding inflammatory changes. Spleen: Normal in size without focal abnormality. Adrenals/Urinary Tract: Adrenal glands are unremarkable. Kidneys are normal, without renal calculi, focal lesion, or hydronephrosis. Bladder is unremarkable. Stomach/Bowel: On this non oral contrast exam large bowel has a normal course and caliber. Mild stool. Normal appendix extends medial to the cecum in the right hemipelvis. The stomach and small bowel are nondilated. There is some slight stranding adjacent to the distal stomach and proximal duodenal with a prominent node as seen on axial series 2, image 36 measuring short axis of 6 mm. Larger than 2022 CT. Vascular/Lymphatic: Normal caliber aorta and IVC. No areas of abnormal nodal enlargement clearly identified in the abdomen and pelvis. Reproductive: Uterus and bilateral adnexa are unremarkable. Other: No free air or free fluid. Musculoskeletal: No acute or significant osseous findings. IMPRESSION: No bowel obstruction, free air or free fluid. Scattered stool. Normal appendix. Slight stranding in the area of the distal stomach and proximal duodenal with a prominent lymph node. Please correlate with particular symptoms. If needed short follow up or a  contrast study could be considered as clinically appropriate. No obstructing renal stones Electronically Signed   By: Ranell Bring M.D.   On: 07/01/2023 15:42   DG Chest 2 View Result Date: 07/01/2023 CLINICAL DATA:  Progressive chest and abdominal pain for 1 week. EXAM: CHEST - 2 VIEW COMPARISON:  Radiographs 02/09/2019 and 05/24/2012. FINDINGS: The heart size and mediastinal contours are normal. The lungs are clear. There is no pleural effusion or pneumothorax. No acute osseous findings are identified. IMPRESSION: No evidence of acute cardiopulmonary process. Electronically Signed   By: Elsie Perone M.D.   On: 07/01/2023 08:16    07-22-2023 EGD  Findings:      LA Grade C (one or more mucosal breaks continuous between tops of 2 or       more mucosal folds, less than 75% circumference) esophagitis with no       bleeding was found 32 to 40 cm from the incisors.      A 2 cm hiatal hernia was present.      Patchy mild inflammation characterized by congestion (edema), erosions,       erythema and friability was found in the cardia, in the gastric body, in       the gastric antrum and in the prepyloric region of the stomach. Biopsies       were taken with a cold forceps for Helicobacter pylori testing.      The cardia and gastric fundus were normal on retroflexion.      One non-bleeding cratered duodenal ulcer with no stigmata of bleeding       was found in the duodenal bulb. The lesion was 15 mm in largest       dimension. Biopsies were taken with a cold forceps from raised ulcer       edge for histology.      The second portion of the duodenum and third portion of the duodenum       were normal. Impression:               -  LA Grade C reflux esophagitis with no bleeding.                           - 2 cm hiatal hernia.                           - Gastritis. Biopsied.                           - Non-bleeding duodenal ulcer with no stigmata of                            bleeding. Biopsied  ulcer edge.                           - Normal second portion of the duodenum and third                            portion of the duodenum. Recommendation:           - Resume previous diet.                           - Continue present medications.                           - Await pathology results.                           - Follow an antireflux regimen.                           - Use Protonix  (pantoprazole ) 40 mg IV BID during                            hospitalization and then transition to PO BID on                            discharge, continue for additional 6 months.                           - Use sucralfate  tablets 1 gram PO QID for 2 weeks.                           - No aspirin, ibuprofen, naproxen, or other                            non-steroidal anti-inflammatory drugs.                           - Discussed with patient to abstain from alcohol                            and Marijuana or any recreational drugs  Time coordinating discharge: 45 mins  SIGNED:  Camellia Door, DO [facility] 07/22/23, 12:35 PM

## 2023-07-23 LAB — SURGICAL PATHOLOGY

## 2023-07-24 ENCOUNTER — Encounter (HOSPITAL_COMMUNITY): Payer: Self-pay | Admitting: Gastroenterology

## 2023-08-13 ENCOUNTER — Encounter: Payer: Self-pay | Admitting: Gastroenterology

## 2023-08-29 ENCOUNTER — Ambulatory Visit: Payer: Self-pay | Admitting: Physician Assistant

## 2023-10-08 ENCOUNTER — Ambulatory Visit: Payer: BC Managed Care – PPO | Admitting: Physician Assistant

## 2023-12-21 ENCOUNTER — Emergency Department

## 2023-12-21 ENCOUNTER — Encounter: Payer: Self-pay | Admitting: Internal Medicine

## 2023-12-21 ENCOUNTER — Observation Stay
Admission: EM | Admit: 2023-12-21 | Discharge: 2023-12-22 | Disposition: A | Discharge status: Home / Self Care | Attending: Internal Medicine | Admitting: Internal Medicine

## 2023-12-21 ENCOUNTER — Other Ambulatory Visit: Payer: Self-pay

## 2023-12-21 DIAGNOSIS — K209 Esophagitis, unspecified without bleeding: Secondary | ICD-10-CM | POA: Insufficient documentation

## 2023-12-21 DIAGNOSIS — R1013 Epigastric pain: Secondary | ICD-10-CM | POA: Diagnosis not present

## 2023-12-21 DIAGNOSIS — Z79899 Other long term (current) drug therapy: Secondary | ICD-10-CM | POA: Insufficient documentation

## 2023-12-21 DIAGNOSIS — K2980 Duodenitis without bleeding: Secondary | ICD-10-CM | POA: Insufficient documentation

## 2023-12-21 DIAGNOSIS — R079 Chest pain, unspecified: Secondary | ICD-10-CM | POA: Diagnosis not present

## 2023-12-21 DIAGNOSIS — Z8719 Personal history of other diseases of the digestive system: Secondary | ICD-10-CM | POA: Diagnosis not present

## 2023-12-21 DIAGNOSIS — Z8711 Personal history of peptic ulcer disease: Secondary | ICD-10-CM

## 2023-12-21 DIAGNOSIS — R112 Nausea with vomiting, unspecified: Principal | ICD-10-CM

## 2023-12-21 DIAGNOSIS — F1290 Cannabis use, unspecified, uncomplicated: Secondary | ICD-10-CM | POA: Insufficient documentation

## 2023-12-21 DIAGNOSIS — Z87891 Personal history of nicotine dependence: Secondary | ICD-10-CM | POA: Diagnosis not present

## 2023-12-21 DIAGNOSIS — K2970 Gastritis, unspecified, without bleeding: Secondary | ICD-10-CM | POA: Diagnosis present

## 2023-12-21 DIAGNOSIS — R109 Unspecified abdominal pain: Secondary | ICD-10-CM | POA: Diagnosis present

## 2023-12-21 LAB — URINALYSIS, ROUTINE W REFLEX MICROSCOPIC
Bacteria, UA: NONE SEEN
Bilirubin Urine: NEGATIVE
Glucose, UA: NEGATIVE mg/dL
Hgb urine dipstick: NEGATIVE
Ketones, ur: 80 mg/dL — AB
Leukocytes,Ua: NEGATIVE
Nitrite: NEGATIVE
Protein, ur: 30 mg/dL — AB
Specific Gravity, Urine: >1.046 — ABNORMAL HIGH (ref 1.005–1.030)
Squamous Epithelial / HPF: 0 /HPF (ref 0–5)
pH: 6 (ref 5.0–8.0)

## 2023-12-21 LAB — CK: Total CK: 294 U/L (ref 49–397)

## 2023-12-21 LAB — COMPREHENSIVE METABOLIC PANEL WITH GFR
ALT: 31 U/L (ref 0–44)
AST: 40 U/L (ref 15–41)
Albumin: 5 g/dL (ref 3.5–5.0)
Alkaline Phosphatase: 90 U/L (ref 38–126)
Anion gap: 17 — ABNORMAL HIGH (ref 5–15)
BUN: 13 mg/dL (ref 6–20)
CO2: 23 mmol/L (ref 22–32)
Calcium: 10.5 mg/dL — ABNORMAL HIGH (ref 8.9–10.3)
Chloride: 100 mmol/L (ref 98–111)
Creatinine, Ser: 1.14 mg/dL (ref 0.61–1.24)
GFR, Estimated: >60 mL/min (ref >60)
Glucose, Bld: 164 mg/dL — ABNORMAL HIGH (ref 70–99)
Potassium: 3.8 mmol/L (ref 3.5–5.1)
Sodium: 140 mmol/L (ref 135–145)
Total Bilirubin: 1.5 mg/dL — ABNORMAL HIGH (ref 0.0–1.2)
Total Protein: 8.8 g/dL — ABNORMAL HIGH (ref 6.5–8.1)

## 2023-12-21 LAB — CBC WITH DIFFERENTIAL/PLATELET
Abs Immature Granulocytes: 0.12 10*3/uL — ABNORMAL HIGH (ref 0.00–0.07)
Basophils Absolute: 0.1 10*3/uL (ref 0.0–0.1)
Basophils Relative: 0 %
Eosinophils Absolute: 0 10*3/uL (ref 0.0–0.5)
Eosinophils Relative: 0 %
HCT: 46.8 % (ref 39.0–52.0)
Hemoglobin: 16.3 g/dL (ref 13.0–17.0)
Immature Granulocytes: 1 %
Lymphocytes Relative: 6 %
Lymphs Abs: 1.4 10*3/uL (ref 0.7–4.0)
MCH: 30.5 pg (ref 26.0–34.0)
MCHC: 34.8 g/dL (ref 30.0–36.0)
MCV: 87.6 fL (ref 80.0–100.0)
Monocytes Absolute: 0.7 10*3/uL (ref 0.1–1.0)
Monocytes Relative: 3 %
Neutro Abs: 20.7 10*3/uL — ABNORMAL HIGH (ref 1.7–7.7)
Neutrophils Relative %: 90 %
Platelets: 385 10*3/uL (ref 150–400)
RBC: 5.34 MIL/uL (ref 4.22–5.81)
RDW: 12.1 % (ref 11.5–15.5)
WBC: 23 10*3/uL — ABNORMAL HIGH (ref 4.0–10.5)
nRBC: 0 % (ref 0.0–0.2)

## 2023-12-21 LAB — CBC
HCT: 42.3 % (ref 39.0–52.0)
Hemoglobin: 14.6 g/dL (ref 13.0–17.0)
MCH: 30.4 pg (ref 26.0–34.0)
MCHC: 34.5 g/dL (ref 30.0–36.0)
MCV: 87.9 fL (ref 80.0–100.0)
Platelets: 287 10*3/uL (ref 150–400)
RBC: 4.81 MIL/uL (ref 4.22–5.81)
RDW: 12.2 % (ref 11.5–15.5)
WBC: 17 10*3/uL — ABNORMAL HIGH (ref 4.0–10.5)
nRBC: 0 % (ref 0.0–0.2)

## 2023-12-21 LAB — URINE DRUG SCREEN, QUALITATIVE (ARMC ONLY)
Amphetamines, Ur Screen: NOT DETECTED
Barbiturates, Ur Screen: NOT DETECTED
Benzodiazepine, Ur Scrn: NOT DETECTED
Cannabinoid 50 Ng, Ur ~~LOC~~: POSITIVE — AB
Cocaine Metabolite,Ur ~~LOC~~: NOT DETECTED
MDMA (Ecstasy)Ur Screen: NOT DETECTED
Methadone Scn, Ur: NOT DETECTED
Opiate, Ur Screen: NOT DETECTED
Phencyclidine (PCP) Ur S: NOT DETECTED
Tricyclic, Ur Screen: NOT DETECTED

## 2023-12-21 LAB — TROPONIN I (HIGH SENSITIVITY)
Troponin I (High Sensitivity): 9 ng/L (ref <18)
Troponin I (High Sensitivity): 19 ng/L — ABNORMAL HIGH (ref <18)

## 2023-12-21 LAB — ETHANOL: Alcohol, Ethyl (B): <15 mg/dL (ref <15)

## 2023-12-21 LAB — LIPASE, BLOOD: Lipase: 25 U/L (ref 11–51)

## 2023-12-21 MED ORDER — SODIUM CHLORIDE 0.9 % IV BOLUS
1000.0000 mL | Freq: Once | INTRAVENOUS | Status: AC
Start: 2023-12-21 — End: 2023-12-21
  Administered 2023-12-21: 1000 mL via INTRAVENOUS

## 2023-12-21 MED ORDER — SODIUM CHLORIDE 0.9 % IV SOLN: INTRAVENOUS | Status: DC

## 2023-12-21 MED ORDER — METOCLOPRAMIDE HCL 5 MG/ML IJ SOLN
5.0000 mg | Freq: Once | INTRAMUSCULAR | Status: AC
  Administered 2023-12-21: 5 mg via INTRAVENOUS
  Filled 2023-12-21: qty 2

## 2023-12-21 MED ORDER — SODIUM CHLORIDE 0.9 % IV BOLUS
1000.0000 mL | Freq: Once | INTRAVENOUS | Status: AC
  Administered 2023-12-21: 1000 mL via INTRAVENOUS

## 2023-12-21 MED ORDER — IOHEXOL 350 MG/ML SOLN
100.0000 mL | Freq: Once | INTRAVENOUS | Status: AC | PRN
  Administered 2023-12-21: 100 mL via INTRAVENOUS

## 2023-12-21 MED ORDER — DROPERIDOL 2.5 MG/ML IJ SOLN
2.5000 mg | Freq: Once | INTRAMUSCULAR | Status: AC
  Administered 2023-12-21: 2.5 mg via INTRAVENOUS
  Filled 2023-12-21: qty 2

## 2023-12-21 MED ORDER — NICOTINE 21 MG/24HR TD PT24
21.0000 mg | MEDICATED_PATCH | Freq: Every day | TRANSDERMAL | Status: DC
  Administered 2023-12-21 – 2023-12-22 (×2): 21 mg via TRANSDERMAL
  Filled 2023-12-21 (×2): qty 1

## 2023-12-21 MED ORDER — ALUM & MAG HYDROXIDE-SIMETH 200-200-20 MG/5ML PO SUSP
30.0000 mL | Freq: Once | ORAL | Status: AC
  Administered 2023-12-21: 30 mL via ORAL
  Filled 2023-12-21: qty 30

## 2023-12-21 MED ORDER — FAMOTIDINE IN NACL 20-0.9 MG/50ML-% IV SOLN
20.0000 mg | Freq: Once | INTRAVENOUS | Status: AC
  Administered 2023-12-21: 20 mg via INTRAVENOUS
  Filled 2023-12-21: qty 50

## 2023-12-21 MED ORDER — ACETAMINOPHEN 325 MG PO TABS
650.0000 mg | ORAL_TABLET | Freq: Four times a day (QID) | ORAL | Status: DC | PRN
Start: 2023-12-21 — End: 2023-12-22

## 2023-12-21 MED ORDER — PROCHLORPERAZINE EDISYLATE 10 MG/2ML IJ SOLN
10.0000 mg | Freq: Four times a day (QID) | INTRAMUSCULAR | Status: DC | PRN
  Administered 2023-12-21 – 2023-12-22 (×3): 10 mg via INTRAVENOUS
  Filled 2023-12-21 (×6): qty 2

## 2023-12-21 MED ORDER — ONDANSETRON HCL 4 MG PO TABS
4.0000 mg | ORAL_TABLET | Freq: Four times a day (QID) | ORAL | Status: DC | PRN
Start: 2023-12-21 — End: 2023-12-22

## 2023-12-21 MED ORDER — PANTOPRAZOLE SODIUM 40 MG IV SOLR
40.0000 mg | INTRAVENOUS | Status: DC
  Administered 2023-12-21: 40 mg via INTRAVENOUS
  Filled 2023-12-21: qty 10

## 2023-12-21 MED ORDER — PANTOPRAZOLE SODIUM 40 MG IV SOLR
40.0000 mg | Freq: Once | INTRAVENOUS | Status: AC
  Administered 2023-12-21: 40 mg via INTRAVENOUS
  Filled 2023-12-21: qty 10

## 2023-12-21 MED ORDER — MORPHINE SULFATE (PF) 2 MG/ML IV SOLN
2.0000 mg | INTRAVENOUS | Status: DC | PRN
  Administered 2023-12-21 – 2023-12-22 (×4): 2 mg via INTRAVENOUS
  Filled 2023-12-21 (×4): qty 1

## 2023-12-21 MED ORDER — ACETAMINOPHEN 650 MG RE SUPP: 650.0000 mg | Freq: Four times a day (QID) | RECTAL | Status: DC | PRN

## 2023-12-21 MED ORDER — ONDANSETRON HCL 4 MG/2ML IJ SOLN
4.0000 mg | Freq: Four times a day (QID) | INTRAMUSCULAR | Status: DC | PRN
  Administered 2023-12-21 – 2023-12-22 (×3): 4 mg via INTRAVENOUS
  Filled 2023-12-21 (×3): qty 2

## 2023-12-21 MED ORDER — LIDOCAINE VISCOUS HCL 2 % MT SOLN
15.0000 mL | Freq: Once | OROMUCOSAL | Status: AC
  Administered 2023-12-21: 15 mL via OROMUCOSAL
  Filled 2023-12-21: qty 15

## 2023-12-21 NOTE — Progress Notes (Signed)
 Progress Note   Patient: Bernard Benton OZH:[id number] DOB: 07-30-1995 DOA: 12/21/2023     0 DOS: the patient was seen and examined on 12/21/2023   Brief hospital course:  Bernard Benton is a 28 y.o. male with medical history significant for Depression, THC use, hospitalized in January with abdominal pain in the setting of prior H. pylori treatment in December 2024, with EGD 07/22/2023 showing grade C esophagitis and duodenal ulcer who presents to the ED, with epigastric pain with intractable nausea and vomiting.  He denies coffee grounds or blood in vomitus. ED course and data review: Vitals within normal limits Labs notable for WBC 23,000, normal hemoglobin of 16, normal lipase and LFTs except for slightly elevated bilirubin of 1.5.  Mild elevation in anion gap of 17 with normal bicarb and normal blood sugar of 164 Troponin 9, CK2 94, EtOH less than 15   EKG, personally viewed and interpreted showing sinus at 55 with no acute ST-T wave changes   CT abdomen and pelvis showing duodenitis and esophagitis as follows: IMPRESSION: 1. Duodenitis with duodenal ulcer similar to 07/20/2023. No free fluid or air to suggest perforation. 2. Wall thickening of the lower esophagus, which can be seen with esophagitis. Consider endoscopy for further evaluation.     Patient treated with IV fluids several antiemetics, IV pain meds but continued to have intractable nausea.  Assessment and Plan:  History of duodenal ulcer on EGD 07/2023 Intractable nausea and vomiting Will keep n.p.o. except for ice chips Continue IV Protonix , IV antiemetics and IV pain meds Consider GI consult to evaluate for need for repeat endoscopy    DVT prophylaxis: SCDs   Consults: none   Advance Care Planning:   Code Status: Full Code    Family Communication: none   Disposition Plan: Back to previous home environment  Subjective:  Patient seen and examined at bedside this morning Patient had an episode of nausea and  vomiting after initiation of oral feeding today Abdominal pain improving  Physical Exam: Vitals and nursing note reviewed.  Constitutional:      General: He is not in acute distress. HENT:     Head: Normocephalic and atraumatic.  Cardiovascular:     Rate and Rhythm: Normal rate and regular rhythm.     Heart sounds: Normal heart sounds.  Pulmonary:     Effort: Pulmonary effort is normal.     Breath sounds: Normal breath sounds.  Abdominal:     Palpations: Abdomen is soft.     Tenderness: There is abdominal tenderness in the epigastric area.  Neurological:     Mental Status: Mental status is at baseline.       Vitals:   12/21/23 0237 12/21/23 0256 12/21/23 0806 12/21/23 1307  BP:  121/65 121/67 (!) 147/82  Pulse: 62 (!) 46  (!) 53  Resp:  18 14 20   Temp:  98.2 F (36.8 C) 98.2 F (36.8 C) 98.6 F (37 C)  TempSrc:      SpO2: 99% 99% 98% 98%  Weight:      Height:        Data Reviewed:    Latest Ref Rng & Units 12/21/2023    5:24 AM 12/21/2023   12:18 AM 07/21/2023    2:25 AM  CBC  WBC 4.0 - 10.5 K/uL 17.0  23.0  10.1   Hemoglobin 13.0 - 17.0 g/dL 62.9  52.8  41.3   Hematocrit 39.0 - 52.0 % 42.3  46.8  41.2   Platelets  150 - 400 K/uL 287  385  293        Latest Ref Rng & Units 12/21/2023   12:18 AM 07/21/2023    2:25 AM 07/20/2023    4:45 PM  BMP  Glucose 70 - 99 mg/dL 130  93  87   BUN 6 - 20 mg/dL 13  9  10    Creatinine 0.61 - 1.24 mg/dL 8.65  7.84  6.96   Sodium 135 - 145 mmol/L 140  137  137   Potassium 3.5 - 5.1 mmol/L 3.8  3.8  4.8   Chloride 98 - 111 mmol/L 100  104  103   CO2 22 - 32 mmol/L 23  23  20    Calcium 8.9 - 10.3 mg/dL 29.5  9.1  28.4       Family Communication: None at bedside  Disposition: Status is: Observation   Time spent: 56 minutes  Author: Ezzard Holms, MD 12/21/2023 2:38 PM  For on call review [email or web address].

## 2023-12-21 NOTE — Plan of Care (Signed)

## 2023-12-21 NOTE — Assessment & Plan Note (Signed)
 Intractable nausea and vomiting Will keep n.p.o. except for ice chips IV Protonix , IV antiemetics and IV pain meds Consider GI consult to evaluate for need for repeat endoscopy

## 2023-12-21 NOTE — TOC Initial Note (Signed)
 Transition of Care [facility]) - Initial/Assessment Note    Patient Details  Name: Bernard Benton MRN: [id number] Date of Birth: 09-Apr-1996  Transition of Care [facility]) CM/SW Contact:    Alexandra Ice, RN Phone Number: 12/21/2023, 10:15 AM  Clinical Narrative:                  Patient lives with spouse, Orion Birks. Independent with ADLs, no home equipment. He has PCP. No TOC needs identified at this time will continue to follow.        Patient Goals and CMS Choice            Expected Discharge Plan and Services                                              Prior Living Arrangements/Services                       Activities of Daily Living   ADL Screening (condition at time of admission) Independently performs ADLs?: Yes (appropriate for developmental age) Is the patient deaf or have difficulty hearing?: No Does the patient have difficulty seeing, even when wearing glasses/contacts?: No Does the patient have difficulty concentrating, remembering, or making decisions?: No  Permission Sought/Granted                  Emotional Assessment              Admission diagnosis:  Epigastric pain [R10.13] Intractable nausea and vomiting [R11.2] Esophagitis [K20.90] Duodenitis [K29.80] Chest pain, unspecified type [R07.9] Nausea and vomiting, unspecified vomiting type [R11.2] Patient Active Problem List   Diagnosis Date Noted   History of duodenal ulcer on EGD 07/2023 12/21/2023   Intractable nausea and vomiting 12/21/2023   Gastroesophageal reflux disease with esophagitis without hemorrhage 07/22/2023   Acute duodenitis 07/22/2023   Gastritis and gastroduodenitis 07/22/2023   Upper GI bleeding 07/21/2023   Obesity, Class I, BMI 30-34.9 07/21/2023   Duodenal ulcer 07/20/2023   PTSD (post-traumatic stress disorder) 05/16/2023   Episode of recurrent major depressive disorder (HCC) 05/15/2023   Mood disorder (HCC) 05/15/2023   PCP:  Center,  [facility]:   [facility] [address] [place] - [address] [place] [address] Phone: [phone] Fax: [phone]  [facility][id number] [place], [place] - [address] AT [facility] [address] [place] [address] Phone: [phone] Fax: [phone]     Social Drivers of Health (SDOH) Social History: SDOH Screenings   Food Insecurity: No Food Insecurity (12/21/2023)  Housing: Low Risk  (12/21/2023)  Transportation Needs: No Transportation Needs (12/21/2023)  Utilities: Not At Risk (12/21/2023)  Alcohol Screen: Low Risk  (05/15/2023)  Tobacco Use: Medium Risk (07/22/2023)   SDOH Interventions:     Readmission Risk Interventions     No data to display

## 2023-12-21 NOTE — H&P (Signed)
 History and Physical    Patient: Bernard Benton UEA:[id number] DOB: [birth year] DOA: 12/21/2023 DOS: the patient was seen and examined on 12/21/2023 PCP: [facility]  Patient coming from: Home  Chief Complaint:  Chief Complaint  Patient presents with   Abdominal Pain    HPI: Bernard Benton is a 28 y.o. male with medical history significant for Depression, THC use, hospitalized in January with abdominal pain in the setting of prior H. pylori treatment in December 2024, with EGD 07/22/2023 showing grade C esophagitis and duodenal ulcer who presents to the ED, with epigastric pain with intractable nausea and vomiting.  He denies coffee grounds or blood in vomitus. ED course and data review: Vitals within normal limits Labs notable for WBC 23,000, normal hemoglobin of 16, normal lipase and LFTs except for slightly elevated bilirubin of 1.5.  Mild elevation in anion gap of 17 with normal bicarb and normal blood sugar of 164 Troponin 9, CK2 94, EtOH less than 15  EKG, personally viewed and interpreted showing sinus at 55 with no acute ST-T wave changes  CT abdomen and pelvis showing duodenitis and esophagitis as follows: IMPRESSION: 1. Duodenitis with duodenal ulcer similar to 07/20/2023. No free fluid or air to suggest perforation. 2. Wall thickening of the lower esophagus, which can be seen with esophagitis. Consider endoscopy for further evaluation.   Patient treated with IV fluids several antiemetics, IV pain meds but continued to have intractable nausea.  Admission requested     Review of Systems: As mentioned in the history of present illness. All other systems reviewed and are negative.  Past Medical History:  Diagnosis Date   Acid reflux    Depressed    Suicidal ideation 05/15/2023   Past Surgical History:  Procedure Laterality Date   BIOPSY  07/22/2023   Procedure: BIOPSY;  Surgeon: Sergio Dandy, MD;  Location: [facility] END[facility];  Service:  Gastroenterology;;   ESOPHAGOGASTRODUODENOSCOPY N/A 07/22/2023   Procedure: ESOPHAGOGASTRODUODENOSCOPY (EGD);  Surgeon: Nandigam, Kavitha V, MD;  Location: [facility] ENDOSCOPY;  Service: Gastroenterology;  Laterality: N/A;   HIP SURGERY     Social History:  reports that he has quit smoking. He has never used smokeless tobacco. He reports that he does not currently use alcohol. He reports current drug use. Drug: Marijuana.  Allergies  Allergen Reactions   Other     Pt reports allergic to an IV ABX but does not remember which one   Morphine  Rash    Local rash     No family history on file.  Prior to Admission medications   Medication Sig Start Date End Date Taking? Authorizing Provider  hydrOXYzine  (ATARAX ) 25 MG tablet Take 1 tablet (25 mg total) by mouth 3 (three) times daily as needed for anxiety. 05/20/23   Silas Drivers, MD  nicotine  polacrilex (NICORETTE ) 4 MG gum Take 1 each (4 mg total) by mouth as needed for smoking cessation. 05/20/23   Silas Drivers, MD  ondansetron  (ZOFRAN ) 4 MG tablet Take 1 tablet (4 mg total) by mouth every 6 (six) hours as needed for nausea or vomiting. 07/22/23   Unk Garb, DO  pantoprazole  (PROTONIX ) 40 MG tablet Take 1 tablet (40 mg total) by mouth 2 (two) times daily. 07/22/23 09/20/23  Unk Garb, DO  sucralfate  (CARAFATE ) 1 g tablet Take 1 tablet (1 g total) by mouth 4 (four) times daily for 14 days. 07/22/23 08/05/23  Unk Garb, DO    Physical Exam: Vitals:   12/21/23 0012 12/21/23  0022 12/21/23 0045  BP:  116/88   Pulse: 61    Resp: 16    Temp: 97.6 F (36.4 C)    TempSrc: Temporal    SpO2: 100%    Weight:   112 kg  Height:   6\' 2"  (1.88 m)   Physical Exam Vitals and nursing note reviewed.  Constitutional:      General: He is not in acute distress. HENT:     Head: Normocephalic and atraumatic.  Cardiovascular:     Rate and Rhythm: Normal rate and regular rhythm.     Heart sounds: Normal heart sounds.  Pulmonary:     Effort: Pulmonary  effort is normal.     Breath sounds: Normal breath sounds.  Abdominal:     Palpations: Abdomen is soft.     Tenderness: There is abdominal tenderness in the epigastric area.  Neurological:     Mental Status: Mental status is at baseline.     Labs on Admission: I have personally reviewed following labs and imaging studies  CBC: Recent Labs  Lab 12/21/23 0018  WBC 23.0*  NEUTROABS 20.7*  HGB 16.3  HCT 46.8  MCV 87.6  PLT 385   Basic Metabolic Panel: Recent Labs  Lab 12/21/23 0018  NA 140  K 3.8  CL 100  CO2 23  GLUCOSE 164*  BUN 13  CREATININE 1.14  CALCIUM 10.5*   GFR: Estimated Creatinine Clearance: 128.4 mL/min (by C-G formula based on SCr of 1.14 mg/dL). Liver Function Tests: Recent Labs  Lab 12/21/23 0018  AST 40  ALT 31  ALKPHOS 90  BILITOT 1.5*  PROT 8.8*  ALBUMIN 5.0   Recent Labs  Lab 12/21/23 0018  LIPASE 25   No results for input(s): "AMMONIA" in the last 168 hours. Coagulation Profile: No results for input(s): "INR", "PROTIME" in the last 168 hours. Cardiac Enzymes: Recent Labs  Lab 12/21/23 0018  CKTOTAL 294   BNP (last 3 results) No results for input(s): "PROBNP" in the last 8760 hours. HbA1C: No results for input(s): "HGBA1C" in the last 72 hours. CBG: No results for input(s): "GLUCAP" in the last 168 hours. Lipid Profile: No results for input(s): "CHOL", "HDL", "LDLCALC", "TRIG", "CHOLHDL", "LDLDIRECT" in the last 72 hours. Thyroid Function Tests: No results for input(s): "TSH", "T4TOTAL", "FREET4", "T3FREE", "THYROIDAB" in the last 72 hours. Anemia Panel: No results for input(s): "VITAMINB12", "FOLATE", "FERRITIN", "TIBC", "IRON", "RETICCTPCT" in the last 72 hours. Urine analysis:    Component Value Date/Time   COLORURINE YELLOW (A) 10/04/2020 0622   APPEARANCEUR CLEAR (A) 10/04/2020 0622   LABSPEC >1.046 (H) 10/04/2020 0622   PHURINE 7.0 10/04/2020 0622   GLUCOSEU 150 (A) 10/04/2020 0622   HGBUR NEGATIVE 10/04/2020  0622   BILIRUBINUR NEGATIVE 10/04/2020 0622   KETONESUR 80 (A) 10/04/2020 0622   PROTEINUR NEGATIVE 10/04/2020 0622   NITRITE NEGATIVE 10/04/2020 0622   LEUKOCYTESUR NEGATIVE 10/04/2020 0622    Radiological Exams on Admission: CT ABDOMEN PELVIS W CONTRAST Result Date: 12/21/2023 CLINICAL DATA:  Abdominal pain. History of stomach ulcers. Vomiting. Burning sensation into the chest. EXAM: CT ABDOMEN AND PELVIS WITH CONTRAST TECHNIQUE: Multidetector CT imaging of the abdomen and pelvis was performed using the standard protocol following bolus administration of intravenous contrast. RADIATION DOSE REDUCTION: This exam was performed according to the departmental dose-optimization program which includes automated exposure control, adjustment of the mA and/or kV according to patient size and/or use of iterative reconstruction technique. CONTRAST:  OMNIPAQUE  IOHEXOL  350 MG/ML SOLN COMPARISON:  Same day chest radiograph and CT abdomen pelvis 07/20/2023 FINDINGS: Lower chest: No acute abnormality. Hepatobiliary: Unremarkable liver. Normal gallbladder. No biliary dilation. Pancreas: Unremarkable. Spleen: Unremarkable. Adrenals/Urinary Tract: Normal adrenal glands. No urinary calculi or hydronephrosis. Bladder is unremarkable. Stomach/Bowel: Wall thickening of the lower esophagus. Stomach is within normal limits. Wall thickening about the proximal duodenum with focal outpouching measuring 2.0 x 1.8 cm (series 2/image 19). Small duodenal diverticulum. No bowel obstruction. Normal appendix. Vascular/Lymphatic: No significant vascular findings are present. No enlarged abdominal or pelvic lymph nodes. Reproductive: Unremarkable. Other: No free intraperitoneal fluid or air. Musculoskeletal: No acute fracture. IMPRESSION: 1. Duodenitis with duodenal ulcer similar to 07/20/2023. No free fluid or air to suggest perforation. 2. Wall thickening of the lower esophagus, which can be seen with esophagitis. Consider endoscopy  for further evaluation. Electronically Signed   By: Rozell Cornet M.D.   On: 12/21/2023 01:31   DG Chest Port 1 View Result Date: 12/21/2023 CLINICAL DATA:  Vomiting EXAM: PORTABLE CHEST 1 VIEW COMPARISON:  07/01/2023 FINDINGS: The heart size and mediastinal contours are within normal limits. Both lungs are clear. The visualized skeletal structures are unremarkable. IMPRESSION: No active disease. Electronically Signed   By: Violeta Grey M.D.   On: 12/21/2023 00:52   Data Reviewed for HPI: Relevant notes from primary care and specialist visits, past discharge summaries as available in EHR, including Care Everywhere. Prior diagnostic testing as pertinent to current admission diagnoses Updated medications and problem lists for reconciliation ED course, including vitals, labs, imaging, treatment and response to treatment Triage notes, nursing and pharmacy notes and ED provider's notes Notable results as noted above in HPI      Assessment and Plan: History of duodenal ulcer on EGD 07/2023 Intractable nausea and vomiting Will keep n.p.o. except for ice chips IV Protonix , IV antiemetics and IV pain meds Consider GI consult to evaluate for need for repeat endoscopy        DVT prophylaxis: SCDs  Consults: none  Advance Care Planning:   Code Status: Full Code   Family Communication: none  Disposition Plan: Back to previous home environment  Severity of Illness: The appropriate patient status for this patient is OBSERVATION. Observation status is judged to be reasonable and necessary in order to provide the required intensity of service to ensure the patient's safety. The patient's presenting symptoms, physical exam findings, and initial radiographic and laboratory data in the context of their medical condition is felt to place them at decreased risk for further clinical deterioration. Furthermore, it is anticipated that the patient will be medically stable for discharge from the  hospital within 2 midnights of admission.   Author: Lanetta Pion, MD 12/21/2023 2:00 AM  For on call review [email or web address].

## 2023-12-21 NOTE — Plan of Care (Signed)
   Problem: Education: Goal: Knowledge of General Education information will improve Description Including pain rating scale, medication(s)/side effects and non-pharmacologic comfort measures Outcome: Progressing

## 2023-12-21 NOTE — ED Provider Notes (Signed)
 [facility] Provider Note    Event Date/Time   First MD Initiated Contact with Patient 12/21/23 0012     (approximate)   History   Abdominal Pain   HPI  Bernard Benton is a 28 y.o. male brought to the ED via EMS from home with a chief complaint of epigastric abdominal burning sensation radiating to his chest associated with nausea and vomiting. Patient reports doing an intense workout around 5:30pm when his stomach began to hurt. Drank an electrolyte drink an employee gave him and pain increased. History of ulcers due to heavy NSAID use and took his medicine but vomited. Denies diaphoresis, shortness of breath, dysuria or diarrhea.     Past Medical History   Past Medical History:  Diagnosis Date  . Acid reflux   . Depressed   . Suicidal ideation 05/15/2023     Active Problem List   Patient Active Problem List   Diagnosis Date Noted  . Gastroesophageal reflux disease with esophagitis without hemorrhage 07/22/2023  . Acute duodenitis 07/22/2023  . Gastritis and gastroduodenitis 07/22/2023  . Upper GI bleeding 07/21/2023  . Obesity, Class I, BMI 30-34.9 07/21/2023  . Duodenal ulcer 07/20/2023  . PTSD (post-traumatic stress disorder) 05/16/2023  . Episode of recurrent major depressive disorder (HCC) 05/15/2023  . Mood disorder (HCC) 05/15/2023     Past Surgical History   Past Surgical History:  Procedure Laterality Date  . BIOPSY  07/22/2023   Procedure: BIOPSY;  Surgeon: Sergio Dandy, MD;  Location: [facility] ENDOSCOPY;  Service: Gastroenterology;;  . ESOPHAGOGASTRODUODENOSCOPY N/A 07/22/2023   Procedure: ESOPHAGOGASTRODUODENOSCOPY (EGD);  Surgeon: Nandigam, Kavitha V, MD;  Location: [facility] ENDOSCOPY;  Service: Gastroenterology;  Laterality: N/A;  . HIP SURGERY       Home Medications   Prior to Admission medications   Medication Sig Start Date End Date Taking? Authorizing Provider  hydrOXYzine  (ATARAX ) 25 MG tablet Take 1 tablet (25 mg  total) by mouth 3 (three) times daily as needed for anxiety. 05/20/23   Silas Drivers, MD  nicotine  polacrilex (NICORETTE ) 4 MG gum Take 1 each (4 mg total) by mouth as needed for smoking cessation. 05/20/23   Silas Drivers, MD  ondansetron  (ZOFRAN ) 4 MG tablet Take 1 tablet (4 mg total) by mouth every 6 (six) hours as needed for nausea or vomiting. 07/22/23   Unk Garb, DO  pantoprazole  (PROTONIX ) 40 MG tablet Take 1 tablet (40 mg total) by mouth 2 (two) times daily. 07/22/23 09/20/23  Unk Garb, DO  sucralfate  (CARAFATE ) 1 g tablet Take 1 tablet (1 g total) by mouth 4 (four) times daily for 14 days. 07/22/23 08/05/23  Unk Garb, DO     Allergies  Other and Morphine    Family History  No family history on file.   Physical Exam  Triage Vital Signs: ED Triage Vitals  Encounter Vitals Group     BP --      Systolic BP Percentile --      Diastolic BP Percentile --      Pulse Rate 12/21/23 0012 61     Resp 12/21/23 0012 16     Temp 12/21/23 0012 97.6 F (36.4 C)     Temp Source 12/21/23 0012 Temporal     SpO2 12/21/23 0012 100 %     Weight --      Height --      Head Circumference --      Peak Flow --      Pain Score 12/21/23  0015 10     Pain Loc --      Pain Education --      Exclude from Growth Chart --     Updated Vital Signs: BP 116/88 (BP Location: Right Arm)   Pulse 61   Temp 97.6 F (36.4 C) (Temporal)   Resp 16   Ht 6\' 2"  (1.88 m)   Wt 112 kg   SpO2 100%   BMI 31.70 kg/m    General: Awake, moderate distress.  CV:  RRR. Good peripheral perfusion.  Resp:  Normal effort. CTAB. Abd:  Mild to moderate tenderness to palpation upper abdomen without rebound or guarding. No distention. Active dry heaving. Other:  No truncal vesicles.   ED Results / Procedures / Treatments  Labs (all labs ordered are listed, but only abnormal results are displayed) Labs Reviewed  CBC WITH DIFFERENTIAL/PLATELET - Abnormal; Notable for the following components:      Result Value    WBC 23.0 (*)    Neutro Abs 20.7 (*)    Abs Immature Granulocytes 0.12 (*)    All other components within normal limits  COMPREHENSIVE METABOLIC PANEL WITH GFR - Abnormal; Notable for the following components:   Glucose, Bld 164 (*)    Calcium 10.5 (*)    Total Protein 8.8 (*)    Total Bilirubin 1.5 (*)    Anion gap 17 (*)    All other components within normal limits  ETHANOL  LIPASE, BLOOD  CK  URINALYSIS, ROUTINE W REFLEX MICROSCOPIC  URINE DRUG SCREEN, QUALITATIVE ([facility] ONLY)  TROPONIN I (HIGH SENSITIVITY)  TROPONIN I (HIGH SENSITIVITY)     EKG  ED ECG REPORT I, Geneva Barrero J, the attending physician, personally viewed and interpreted this ECG.   Date: 12/21/2023  EKG Time: 0013  Rate: 55  Rhythm: sinus bradycardia  Axis: Normal  Intervals:none  ST&T Change: Nonspecific    RADIOLOGY I have independently visualized and interpreted patient's imaging study as well as noted the radiology interpretation:  CXR: No acute cardiopulmonary process  CT abd/pel: duodenitis w/ duodenal ulcer, esophagitis  Official radiology report(s): CT ABDOMEN PELVIS W CONTRAST Result Date: 12/21/2023 CLINICAL DATA:  Abdominal pain. History of stomach ulcers. Vomiting. Burning sensation into the chest. EXAM: CT ABDOMEN AND PELVIS WITH CONTRAST TECHNIQUE: Multidetector CT imaging of the abdomen and pelvis was performed using the standard protocol following bolus administration of intravenous contrast. RADIATION DOSE REDUCTION: This exam was performed according to the departmental dose-optimization program which includes automated exposure control, adjustment of the mA and/or kV according to patient size and/or use of iterative reconstruction technique. CONTRAST:  OMNIPAQUE  IOHEXOL  350 MG/ML SOLN COMPARISON:  Same day chest radiograph and CT abdomen pelvis 07/20/2023 FINDINGS: Lower chest: No acute abnormality. Hepatobiliary: Unremarkable liver. Normal gallbladder. No biliary dilation.  Pancreas: Unremarkable. Spleen: Unremarkable. Adrenals/Urinary Tract: Normal adrenal glands. No urinary calculi or hydronephrosis. Bladder is unremarkable. Stomach/Bowel: Wall thickening of the lower esophagus. Stomach is within normal limits. Wall thickening about the proximal duodenum with focal outpouching measuring 2.0 x 1.8 cm (series 2/image 19). Small duodenal diverticulum. No bowel obstruction. Normal appendix. Vascular/Lymphatic: No significant vascular findings are present. No enlarged abdominal or pelvic lymph nodes. Reproductive: Unremarkable. Other: No free intraperitoneal fluid or air. Musculoskeletal: No acute fracture. IMPRESSION: 1. Duodenitis with duodenal ulcer similar to 07/20/2023. No free fluid or air to suggest perforation. 2. Wall thickening of the lower esophagus, which can be seen with esophagitis. Consider endoscopy for further evaluation. Electronically Signed   By:  Rozell Cornet M.D.   On: 12/21/2023 01:31   DG Chest Port 1 View Result Date: 12/21/2023 CLINICAL DATA:  Vomiting EXAM: PORTABLE CHEST 1 VIEW COMPARISON:  07/01/2023 FINDINGS: The heart size and mediastinal contours are within normal limits. Both lungs are clear. The visualized skeletal structures are unremarkable. IMPRESSION: No active disease. Electronically Signed   By: Violeta Grey M.D.   On: 12/21/2023 00:52     PROCEDURES:  Critical Care performed: Yes, see critical care procedure note(s)  CRITICAL CARE Performed by: Norlene Beavers   Total critical care time: 45 minutes  Critical care time was exclusive of separately billable procedures and treating other patients.  Critical care was necessary to treat or prevent imminent or life-threatening deterioration.  Critical care was time spent personally by me on the following activities: development of treatment plan with patient and/or surrogate as well as nursing, discussions with consultants, evaluation of patient's response to treatment, examination of  patient, obtaining history from patient or surrogate, ordering and performing treatments and interventions, ordering and review of laboratory studies, ordering and review of radiographic studies, pulse oximetry and re-evaluation of patient's condition.   Aaron Aas1-3 Lead EKG Interpretation  Performed by: Norlene Beavers, MD Authorized by: Norlene Beavers, MD     Interpretation: normal     ECG rate:  61   ECG rate assessment: normal     Rhythm: sinus rhythm     Ectopy: none     Conduction: normal   Comments:     Patient placed on cardiac monitor to evaluate for arrthymias    MEDICATIONS ORDERED IN ED: Medications  famotidine  (PEPCID ) IVPB 20 mg premix (has no administration in time range)  metoCLOPramide  (REGLAN ) injection 5 mg (has no administration in time range)  sodium chloride  0.9 % bolus 1,000 mL (1,000 mLs Intravenous New Bag/Given 12/21/23 0022)  droperidol  (INAPSINE ) 2.5 MG/ML injection 2.5 mg (2.5 mg Intravenous Given 12/21/23 0023)  pantoprazole  (PROTONIX ) injection 40 mg (40 mg Intravenous Given 12/21/23 0019)  alum & mag hydroxide-simeth (MAALOX/MYLANTA) 200-200-20 MG/5ML suspension 30 mL (30 mLs Oral Given 12/21/23 0056)  lidocaine  (XYLOCAINE ) 2 % viscous mouth solution 15 mL (15 mLs Mouth/Throat Given 12/21/23 0056)  sodium chloride  0.9 % bolus 1,000 mL (1,000 mLs Intravenous New Bag/Given 12/21/23 0142)  iohexol  (OMNIPAQUE ) 350 MG/ML injection 100 mL (100 mLs Intravenous Contrast Given 12/21/23 0111)     IMPRESSION / MDM / ASSESSMENT AND PLAN / ED COURSE  I reviewed the triage vital signs and the nursing notes.                             28 year old male presenting with abdominal pain and emesis. Differential diagnosis includes, but is not limited to, biliary disease (biliary colic, acute cholecystitis, cholangitis, choledocholithiasis, etc), intrathoracic causes for epigastric abdominal pain including ACS, gastritis, duodenitis, pancreatitis, small bowel or large bowel obstruction,  abdominal aortic aneurysm, hernia, and ulcer(s). I have personally reviewed patient's records and note hospitalization for duodenitis 07/20/2023. EGD notable for LA Grade C refulx esophagitis, hiatal hernia, gastritis, non-bleeding duodenal ulcer.  Patient's presentation is most consistent with acute complicated illness / injury requiring diagnostic workup.  The patient is on the cardiac monitor to evaluate for evidence of arrhythmia and/or significant heart rate changes.  Will obtain labwork, cxr. Administer IV fluids, Protonix , Droperidol  for emesis. Keep NPO. Will reassess.  Clinical Course as of 12/21/23 0144  Sat Dec 21, 2023  0054 Pain improved after Droperidol , requesting additional meds for acid reflux. Laboratory results show leukocytosis, mildly elevated AG with glucose 164 in this non-diabetic; likely stress/dehydration induced. Will obtain CT abd/pel to evaluate intra-abdominal pathology. [JS]  0141 Updated patient on all laboratory and imaging results. Still complains of burning pain despite IV Protonix  and GI cocktail. Will add IV Pepcid . Will consult hospitalist service for evaluation and admission. [JS]    Clinical Course User Index [JS] Norlene Beavers, MD     FINAL CLINICAL IMPRESSION(S) / ED DIAGNOSES   Final diagnoses:  Epigastric pain  Chest pain, unspecified type  Nausea and vomiting, unspecified vomiting type  Duodenitis  Esophagitis     Rx / DC Orders   ED Discharge Orders     None        Note:  This document was prepared using Dragon voice recognition software and may include unintentional dictation errors.   Norlene Beavers, MD 12/21/23 [phone]

## 2023-12-21 NOTE — ED Triage Notes (Signed)
 BIB EMS:  Pt stated that he was working out around 5:30 and only worked out for 30 minutes and stomach started hurting.  He said he drank an electrolyte drink that an employee gave him.  Hx of stomach ulcers but states it feels different.  Took his meds and threw them up.  Pain is a burning sensation that burns up to his chest.  Does Smoke and drinks

## 2023-12-22 DIAGNOSIS — R112 Nausea with vomiting, unspecified: Secondary | ICD-10-CM | POA: Diagnosis not present

## 2023-12-22 LAB — CBC WITH DIFFERENTIAL/PLATELET
Abs Immature Granulocytes: 0.13 10*3/uL — ABNORMAL HIGH (ref 0.00–0.07)
Basophils Absolute: 0 10*3/uL (ref 0.0–0.1)
Basophils Relative: 0 %
Eosinophils Absolute: 0 10*3/uL (ref 0.0–0.5)
Eosinophils Relative: 0 %
HCT: 39.4 % (ref 39.0–52.0)
Hemoglobin: 13.3 g/dL (ref 13.0–17.0)
Immature Granulocytes: 1 %
Lymphocytes Relative: 17 %
Lymphs Abs: 3.2 10*3/uL (ref 0.7–4.0)
MCH: 30.2 pg (ref 26.0–34.0)
MCHC: 33.8 g/dL (ref 30.0–36.0)
MCV: 89.3 fL (ref 80.0–100.0)
Monocytes Absolute: 1.6 10*3/uL — ABNORMAL HIGH (ref 0.1–1.0)
Monocytes Relative: 8 %
Neutro Abs: 14.4 10*3/uL — ABNORMAL HIGH (ref 1.7–7.7)
Neutrophils Relative %: 74 %
Platelets: 248 10*3/uL (ref 150–400)
RBC: 4.41 MIL/uL (ref 4.22–5.81)
RDW: 12.2 % (ref 11.5–15.5)
WBC: 19.4 10*3/uL — ABNORMAL HIGH (ref 4.0–10.5)
nRBC: 0 % (ref 0.0–0.2)

## 2023-12-22 LAB — BASIC METABOLIC PANEL WITH GFR
Anion gap: 9 (ref 5–15)
BUN: 10 mg/dL (ref 6–20)
CO2: 22 mmol/L (ref 22–32)
Calcium: 8.5 mg/dL — ABNORMAL LOW (ref 8.9–10.3)
Chloride: 102 mmol/L (ref 98–111)
Creatinine, Ser: 0.77 mg/dL (ref 0.61–1.24)
GFR, Estimated: >60 mL/min (ref >60)
Glucose, Bld: 96 mg/dL (ref 70–99)
Potassium: 3.3 mmol/L — ABNORMAL LOW (ref 3.5–5.1)
Sodium: 136 mmol/L (ref 135–145)

## 2023-12-22 MED ORDER — POTASSIUM CHLORIDE 20 MEQ PO PACK
40.0000 meq | PACK | Freq: Once | ORAL | Status: AC
  Administered 2023-12-22: 40 meq via ORAL
  Filled 2023-12-22: qty 2

## 2023-12-22 NOTE — Discharge Summary (Signed)
 Physician Discharge Summary   Patient: Bernard Benton MRN: [id number] DOB: 04/23/1996  Admit date:     12/21/2023  Discharge date: 12/22/23  Discharge Physician: Ezzard Holms   [facility]P: [facility]   Recommendations at discharge:  Follow-up with PCP  Discharge Diagnoses: History of duodenal ulcer on EGD 07/2023 Intractable nausea and vomiting  Hospital Course:   Bernard Benton is a 28 y.o. male with medical history significant for Depression, THC use, hospitalized in January with abdominal pain in the setting of prior H. pylori treatment in December 2024, with EGD 07/22/2023 showing grade C esophagitis and duodenal ulcer who presents to the ED, with epigastric pain with intractable nausea and vomiting.  He denies coffee grounds or blood in vomitus. ED course and data review: Vitals within normal limits Labs notable for WBC 23,000, normal hemoglobin of 16, normal lipase and LFTs except for slightly elevated bilirubin of 1.5.  Mild elevation in anion gap of 17 with normal bicarb and normal blood sugar of 164 Troponin 9, CK2 94, EtOH less than 15   EKG, personally viewed and interpreted showing sinus at 55 with no acute ST-T wave changes   CT abdomen and pelvis showing duodenitis and esophagitis as follows: IMPRESSION: 1. Duodenitis with duodenal ulcer similar to 07/20/2023. No free fluid or air to suggest perforation. 2. Wall thickening of the lower esophagus, which can be seen with esophagitis. Consider endoscopy for further evaluation.     Patient treated with IV fluids several antiemetics, IV pain meds but continued to have intractable nausea. I recommended continuing observing as well as advancement of diet and if that does not improve to consider possible endoscopic evaluation however patient decided to leave AGAINST MEDICAL ADVICE.    Consultants: None Procedures performed: None Disposition: Home Diet recommendation:  Cardiac diet DISCHARGE  MEDICATION: Patient left AGAINST MEDICAL ADVICE and informed me that he has all his home medication and would not have time to wait for any thing more   Discharge Exam: Filed Weights   12/21/23 0045  Weight: 112 kg   Vitals and nursing note reviewed.  Constitutional:      General: He is not in acute distress. HENT:     Head: Normocephalic and atraumatic.  Cardiovascular:     Rate and Rhythm: Normal rate and regular rhythm.     Heart sounds: Normal heart sounds.  Pulmonary:     Effort: Pulmonary effort is normal.     Breath sounds: Normal breath sounds.  Abdominal:     Palpations: Abdomen is soft.  Neurological:     Mental Status: Mental status is at baseline.       Condition at discharge: good  The results of significant diagnostics from this hospitalization (including imaging, microbiology, ancillary and laboratory) are listed below for reference.   Imaging Studies: CT ABDOMEN PELVIS W CONTRAST Result Date: 12/21/2023 CLINICAL DATA:  Abdominal pain. History of stomach ulcers. Vomiting. Burning sensation into the chest. EXAM: CT ABDOMEN AND PELVIS WITH CONTRAST TECHNIQUE: Multidetector CT imaging of the abdomen and pelvis was performed using the standard protocol following bolus administration of intravenous contrast. RADIATION DOSE REDUCTION: This exam was performed according to the departmental dose-optimization program which includes automated exposure control, adjustment of the mA and/or kV according to patient size and/or use of iterative reconstruction technique. CONTRAST:  OMNIPAQUE  IOHEXOL  350 MG/ML SOLN COMPARISON:  Same day chest radiograph and CT abdomen pelvis 07/20/2023 FINDINGS: Lower chest: No acute abnormality. Hepatobiliary: Unremarkable liver.  Normal gallbladder. No biliary dilation. Pancreas: Unremarkable. Spleen: Unremarkable. Adrenals/Urinary Tract: Normal adrenal glands. No urinary calculi or hydronephrosis. Bladder is unremarkable. Stomach/Bowel: Wall  thickening of the lower esophagus. Stomach is within normal limits. Wall thickening about the proximal duodenum with focal outpouching measuring 2.0 x 1.8 cm (series 2/image 19). Small duodenal diverticulum. No bowel obstruction. Normal appendix. Vascular/Lymphatic: No significant vascular findings are present. No enlarged abdominal or pelvic lymph nodes. Reproductive: Unremarkable. Other: No free intraperitoneal fluid or air. Musculoskeletal: No acute fracture. IMPRESSION: 1. Duodenitis with duodenal ulcer similar to 07/20/2023. No free fluid or air to suggest perforation. 2. Wall thickening of the lower esophagus, which can be seen with esophagitis. Consider endoscopy for further evaluation. Electronically Signed   By: Rozell Cornet M.D.   On: 12/21/2023 01:31   DG Chest Port 1 View Result Date: 12/21/2023 CLINICAL DATA:  Vomiting EXAM: PORTABLE CHEST 1 VIEW COMPARISON:  07/01/2023 FINDINGS: The heart size and mediastinal contours are within normal limits. Both lungs are clear. The visualized skeletal structures are unremarkable. IMPRESSION: No active disease. Electronically Signed   By: Violeta Grey M.D.   On: 12/21/2023 00:52    Microbiology: Results for orders placed or performed during the hospital encounter of 09/05/20  Resp Panel by RT-PCR (Flu A&B, Covid) Nasopharyngeal Swab     Status: None   Collection Time: 09/05/20  9:55 AM   Specimen: Nasopharyngeal Swab; Nasopharyngeal(NP) swabs in vial transport medium  Result Value Ref Range Status   SARS Coronavirus 2 by RT PCR NEGATIVE NEGATIVE Final    Comment: (NOTE) SARS-CoV-2 target nucleic acids are NOT DETECTED.  The SARS-CoV-2 RNA is generally detectable in upper respiratory specimens during the acute phase of infection. The lowest concentration of SARS-CoV-2 viral copies this assay can detect is 138 copies/mL. A negative result does not preclude SARS-Cov-2 infection and should not be used as the sole basis for treatment or other  patient management decisions. A negative result may occur with  improper specimen collection/handling, submission of specimen other than nasopharyngeal swab, presence of viral mutation(s) within the areas targeted by this assay, and inadequate number of viral copies(<138 copies/mL). A negative result must be combined with clinical observations, patient history, and epidemiological information. The expected result is Negative.  Fact Sheet for Patients:  BloggerCourse.com  Fact Sheet for Healthcare Providers:  SeriousBroker.it  This test is no t yet approved or cleared by the [place]  FDA and  has been authorized for detection and/or diagnosis of SARS-CoV-2 by FDA under an Emergency Use Authorization (EUA). This EUA will remain  in effect (meaning this test can be used) for the duration of the COVID-19 declaration under Section 564(b)(1) of the Act, 21 U.S.C.section 360bbb-3(b)(1), unless the authorization is terminated  or revoked sooner.       Influenza A by PCR NEGATIVE NEGATIVE Final   Influenza B by PCR NEGATIVE NEGATIVE Final    Comment: (NOTE) The Xpert Xpress SARS-CoV-2/FLU/RSV plus assay is intended as an aid in the diagnosis of influenza from Nasopharyngeal swab specimens and should not be used as a sole basis for treatment. Nasal washings and aspirates are unacceptable for Xpert Xpress SARS-CoV-2/FLU/RSV testing.  Fact Sheet for Patients: BloggerCourse.com  Fact Sheet for Healthcare Providers: SeriousBroker.it  This test is not yet approved or cleared by the [place]  FDA and has been authorized for detection and/or diagnosis of SARS-CoV-2 by FDA under an Emergency Use Authorization (EUA). This EUA will remain in effect (meaning this test can be used)  for the duration of the COVID-19 declaration under Section 564(b)(1) of the Act, 21 U.S.C. section  360bbb-3(b)(1), unless the authorization is terminated or revoked.  Performed at [facility], [address] [place], [place] [address]     Labs: CBC: Recent Labs  Lab 12/21/23 0018 12/21/23 0524 12/22/23 0449  WBC 23.0* 17.0* 19.4*  NEUTROABS 20.7*  --  14.4*  HGB 16.3 14.6 13.3  HCT 46.8 42.3 39.4  MCV 87.6 87.9 89.3  PLT 385 287 248   Basic Metabolic Panel: Recent Labs  Lab 12/21/23 0018 12/22/23 0449  NA 140 136  K 3.8 3.3*  CL 100 102  CO2 23 22  GLUCOSE 164* 96  BUN 13 10  CREATININE 1.14 0.77  CALCIUM 10.5* 8.5*   Liver Function Tests: Recent Labs  Lab 12/21/23 0018  AST 40  ALT 31  ALKPHOS 90  BILITOT 1.5*  PROT 8.8*  ALBUMIN 5.0   CBG: No results for input(s): "GLUCAP" in the last 168 hours.  Discharge time spent:  38 minutes.  Signed: Ezzard Holms, MD [facility] 12/22/2023

## 2023-12-26 ENCOUNTER — Other Ambulatory Visit: Payer: Self-pay

## 2023-12-26 ENCOUNTER — Emergency Department
Admission: EM | Admit: 2023-12-26 | Discharge: 2023-12-26 | Disposition: A | Discharge status: Home / Self Care | Attending: Emergency Medicine | Admitting: Emergency Medicine

## 2023-12-26 DIAGNOSIS — R112 Nausea with vomiting, unspecified: Secondary | ICD-10-CM | POA: Insufficient documentation

## 2023-12-26 DIAGNOSIS — R197 Diarrhea, unspecified: Secondary | ICD-10-CM | POA: Insufficient documentation

## 2023-12-26 DIAGNOSIS — Z5329 Procedure and treatment not carried out because of patient's decision for other reasons: Secondary | ICD-10-CM | POA: Insufficient documentation

## 2023-12-26 LAB — COMPREHENSIVE METABOLIC PANEL WITH GFR
ALT: 25 U/L (ref 0–44)
AST: 21 U/L (ref 15–41)
Albumin: 4 g/dL (ref 3.5–5.0)
Alkaline Phosphatase: 70 U/L (ref 38–126)
Anion gap: 9 (ref 5–15)
BUN: 9 mg/dL (ref 6–20)
CO2: 24 mmol/L (ref 22–32)
Calcium: 9.1 mg/dL (ref 8.9–10.3)
Chloride: 105 mmol/L (ref 98–111)
Creatinine, Ser: 0.75 mg/dL (ref 0.61–1.24)
GFR, Estimated: >60 mL/min (ref >60)
Glucose, Bld: 120 mg/dL — ABNORMAL HIGH (ref 70–99)
Potassium: 3.2 mmol/L — ABNORMAL LOW (ref 3.5–5.1)
Sodium: 138 mmol/L (ref 135–145)
Total Bilirubin: 0.8 mg/dL (ref 0.0–1.2)
Total Protein: 7.4 g/dL (ref 6.5–8.1)

## 2023-12-26 LAB — CBC WITH DIFFERENTIAL/PLATELET
Abs Immature Granulocytes: 0.1 10*3/uL — ABNORMAL HIGH (ref 0.00–0.07)
Basophils Absolute: 0.1 10*3/uL (ref 0.0–0.1)
Basophils Relative: 1 %
Eosinophils Absolute: 0 10*3/uL (ref 0.0–0.5)
Eosinophils Relative: 0 %
HCT: 46.1 % (ref 39.0–52.0)
Hemoglobin: 15.5 g/dL (ref 13.0–17.0)
Immature Granulocytes: 1 %
Lymphocytes Relative: 7 %
Lymphs Abs: 1 10*3/uL (ref 0.7–4.0)
MCH: 29.5 pg (ref 26.0–34.0)
MCHC: 33.6 g/dL (ref 30.0–36.0)
MCV: 87.6 fL (ref 80.0–100.0)
Monocytes Absolute: 0.5 10*3/uL (ref 0.1–1.0)
Monocytes Relative: 4 %
Neutro Abs: 13.5 10*3/uL — ABNORMAL HIGH (ref 1.7–7.7)
Neutrophils Relative %: 87 %
Platelets: 273 10*3/uL (ref 150–400)
RBC: 5.26 MIL/uL (ref 4.22–5.81)
RDW: 11.9 % (ref 11.5–15.5)
WBC: 15.3 10*3/uL — ABNORMAL HIGH (ref 4.0–10.5)
nRBC: 0 % (ref 0.0–0.2)

## 2023-12-26 LAB — MAGNESIUM: Magnesium: 2 mg/dL (ref 1.7–2.4)

## 2023-12-26 LAB — TROPONIN I (HIGH SENSITIVITY): Troponin I (High Sensitivity): 2 ng/L (ref <18)

## 2023-12-26 LAB — LIPASE, BLOOD: Lipase: 25 U/L (ref 11–51)

## 2023-12-26 MED ORDER — PANTOPRAZOLE SODIUM 40 MG PO TBEC: 40.0000 mg | DELAYED_RELEASE_TABLET | Freq: Two times a day (BID) | ORAL | 0 refills | Status: AC

## 2023-12-26 MED ORDER — MYLANTA MAXIMUM STRENGTH 400-400-40 MG/5ML PO SUSP
10.0000 mL | Freq: Four times a day (QID) | ORAL | 0 refills | Status: AC | PRN
Start: 2023-12-26 — End: ?

## 2023-12-26 MED ORDER — DROPERIDOL 2.5 MG/ML IJ SOLN
2.5000 mg | Freq: Once | INTRAMUSCULAR | Status: DC
  Filled 2023-12-26: qty 2

## 2023-12-26 MED ORDER — ONDANSETRON HCL 4 MG PO TABS: 4.0000 mg | ORAL_TABLET | Freq: Four times a day (QID) | ORAL | 0 refills | Status: AC | PRN

## 2023-12-26 MED ORDER — PANTOPRAZOLE SODIUM 40 MG IV SOLR
40.0000 mg | Freq: Once | INTRAVENOUS | Status: AC
  Administered 2023-12-26: 40 mg via INTRAVENOUS

## 2023-12-26 MED ORDER — LACTATED RINGERS IV BOLUS
1000.0000 mL | Freq: Once | INTRAVENOUS | Status: AC
  Administered 2023-12-26: 1000 mL via INTRAVENOUS

## 2023-12-26 MED ORDER — FENTANYL CITRATE PF 50 MCG/ML IJ SOSY
50.0000 ug | PREFILLED_SYRINGE | Freq: Once | INTRAMUSCULAR | Status: AC
  Administered 2023-12-26: 50 ug via INTRAVENOUS
  Filled 2023-12-26: qty 1

## 2023-12-26 MED ORDER — DROPERIDOL 2.5 MG/ML IJ SOLN
2.5000 mg | Freq: Once | INTRAMUSCULAR | Status: AC
  Administered 2023-12-26: 2.5 mg via INTRAMUSCULAR

## 2023-12-26 NOTE — Discharge Instructions (Signed)
 Your laboratory workup was reassuring here today.  Please follow-up with GI outpatient.  I have sent medications to your pharmacy to help with symptoms.  Please work on stopping marijuana use as this may be contributing.  Please return for any severe worsening symptoms.

## 2023-12-26 NOTE — ED Triage Notes (Signed)
 Pt to ED from home with emesis since 0500. Pt received Zofran  IM 4 mg roughly 20 mins ago. Pt reports he smoked marijuana last night and was here a few days ago for similar symptoms.   A&O x4, heaving profusely  CBG 116

## 2023-12-26 NOTE — ED Provider Notes (Signed)
 [facility] Provider Note    Event Date/Time   First MD Initiated Contact with Patient 12/26/23 0719     (approximate)   History   Emesis   HPI EVENS MENO is a 28 y.o. male with history of duodenal ulcer, chronic marijuana use presenting today for vomiting.  Patient had onset of vomiting symptoms 6 days ago originally.  He was seen in the ED 5 days ago and diagnosed with duodenitis and duodenal ulcer with intractable nausea and vomiting.  Patient was admitted at that time with otherwise reassuring CT.  He was treated with IV fluids and antiemetics and IV pain medicine.  Patient eventually left AGAINST MEDICAL ADVICE 4 days ago.  He states he was feeling fine until this morning when he had recurrence of nausea with vomiting.  Describes the exact same symptoms with epigastric pain and heartburn.  Denies coffee-ground emesis.  No melena or hematochezia. He notes daily marijuana use. No alcohol use.  Chart review: Reviewed most recent visit and hospitalization.     Physical Exam   Triage Vital Signs: ED Triage Vitals  Encounter Vitals Group     BP 12/26/23 0720 (!) 146/92     Girls Systolic BP Percentile --      Girls Diastolic BP Percentile --      Boys Systolic BP Percentile --      Boys Diastolic BP Percentile --      Pulse Rate 12/26/23 0720 (!) 55     Resp 12/26/23 0720 18     Temp 12/26/23 0720 98.6 F (37 C)     Temp Source 12/26/23 0720 Oral     SpO2 12/26/23 0720 100 %     Weight --      Height --      Head Circumference --      Peak Flow --      Pain Score 12/26/23 0721 6     Pain Loc --      Pain Education --      Exclude from Growth Chart --     Most recent vital signs: Vitals:   12/26/23 0720 12/26/23 1000  BP: (!) 146/92 136/81  Pulse: (!) 55 (!) 51  Resp: 18 13  Temp: 98.6 F (37 C)   SpO2: 100% 99%   Physical Exam: I have reviewed the vital signs and nursing notes. General: Awake, alert, actively vomiting on  exam Head:  Atraumatic, normocephalic.   ENT:  EOM intact, PERRL. Oral mucosa is pink and moist with no lesions. Neck: Neck is supple with full range of motion, No meningeal signs. Cardiovascular:  RRR, No murmurs. Peripheral pulses palpable and equal bilaterally. Respiratory:  Symmetrical chest wall expansion.  No rhonchi, rales, or wheezes.  Good air movement throughout.  No use of accessory muscles.   Musculoskeletal:  No cyanosis or edema. Moving extremities with full ROM Abdomen:  Soft, nontender, nondistended. Neuro:  GCS 15, moving all four extremities, interacting appropriately. Speech clear. Psych:  Calm, appropriate.   Skin:  Warm, dry, no rash.    ED Results / Procedures / Treatments   Labs (all labs ordered are listed, but only abnormal results are displayed) Labs Reviewed  CBC WITH DIFFERENTIAL/PLATELET - Abnormal; Notable for the following components:      Result Value   WBC 15.3 (*)    Neutro Abs 13.5 (*)    Abs Immature Granulocytes 0.10 (*)    All other components within normal limits  COMPREHENSIVE  METABOLIC PANEL WITH GFR - Abnormal; Notable for the following components:   Potassium 3.2 (*)    Glucose, Bld 120 (*)    All other components within normal limits  MAGNESIUM   LIPASE, BLOOD  CBC WITH DIFFERENTIAL/PLATELET  TROPONIN I (HIGH SENSITIVITY)     EKG My EKG interpretation: Rate of 43, sinus bradycardia, normal axis, normal intervals.  No acute ST elevations or depressions   RADIOLOGY    PROCEDURES:  Critical Care performed: No  Procedures   MEDICATIONS ORDERED IN ED: Medications  lactated ringers  bolus 1,000 mL (1,000 mLs Intravenous New Bag/Given 12/26/23 0911)  fentaNYL (SUBLIMAZE) injection 50 mcg (50 mcg Intravenous Given 12/26/23 0911)  pantoprazole  (PROTONIX ) injection 40 mg (40 mg Intravenous Given 12/26/23 0912)  droperidol  (INAPSINE ) 2.5 MG/ML injection 2.5 mg (2.5 mg Intramuscular Given 12/26/23 0911)     IMPRESSION / MDM /  ASSESSMENT AND PLAN / ED COURSE  I reviewed the triage vital signs and the nursing notes.                              Differential diagnosis includes, but is not limited to, duodenitis, duodenal ulcer, gastritis, cannabinoid hyperemesis syndrome, lower concern for upper GI bleed  Patient's presentation is most consistent with acute complicated illness / injury requiring diagnostic workup.  Patient is a 28 year old male presenting today for vomiting and epigastric pain.  Recent diagnosis of duodenitis.  He has no signs of bleeding symptoms here today with no melena or coffee-ground emesis.  Suspect a continuation of symptoms from previous hospitalization 4 days ago that he left AMA for.  Vital signs stable.  Will give droperidol , 1 L fluids, fentanyl, and Protonix .  Laboratory workup today with improving leukocytosis now at 15.3.  CMP with slight hypokalemia but otherwise reassuring.  Lipase, magnesium , troponin all negative.  EKG with no signs of ischemia.  Patient was reassessed following treatments with complete resolution in symptoms.  Discussed whether he needed any additional medication to which he states he feels better.  Given his recent hospitalization where he left AMA, did discuss about bring him back in for ongoing evaluation.  He states he prefers to go home at this time and would not want to be admitted at this time.  He tolerated p.o. without any issue.  He is still consistent that he prefers to go home and does not want to be admitted again.  Sent home with Zofran , Mylanta, and Protonix .  Given strict return precautions as well as GI follow-up.  The patient is on the cardiac monitor to evaluate for evidence of arrhythmia and/or significant heart rate changes. Clinical Course as of 12/26/23 1218  Thu Dec 26, 2023  1025 WBC(!): 15.3 Decreased from recent values [DW]  1049 Patient was reassessed and feeling better at this time.  We discussed p.o. challenge with going home versus  admitting him as he was seen here 5 days ago.  He would like to try p.o. and if successful go home at that time as he prefers not to be admitted. [DW]    Clinical Course User Index [DW] Kandee Orion, MD     FINAL CLINICAL IMPRESSION(S) / ED DIAGNOSES   Final diagnoses:  Nausea vomiting and diarrhea     Rx / DC Orders   ED Discharge Orders          Ordered    pantoprazole  (PROTONIX ) 40 MG tablet  2 times daily  12/26/23 1152    alum & mag hydroxide-simeth (MYLANTA MAXIMUM STRENGTH) 400-400-40 MG/5ML suspension  Every 6 hours PRN        12/26/23 1152    ondansetron  (ZOFRAN ) 4 MG tablet  Every 6 hours PRN        12/26/23 1152             Note:  This document was prepared using Dragon voice recognition software and may include unintentional dictation errors.   Kandee Orion, MD 12/26/23 1220
